# Patient Record
Sex: Male | Born: 1955
Health system: Southern US, Community
[De-identification: ages and names within clinical notes are randomized; demographics above are authoritative.]

## PROBLEM LIST (undated history)

## (undated) DIAGNOSIS — Z973 Presence of spectacles and contact lenses: Secondary | ICD-10-CM

## (undated) DIAGNOSIS — I10 Essential (primary) hypertension: Secondary | ICD-10-CM

## (undated) DIAGNOSIS — J302 Other seasonal allergic rhinitis: Secondary | ICD-10-CM

## (undated) HISTORY — PX: COLONOSCOPY: SHX174

## (undated) HISTORY — PX: WISDOM TOOTH EXTRACTION: SHX21

---

## 1999-05-18 ENCOUNTER — Ambulatory Visit (HOSPITAL_BASED_OUTPATIENT_CLINIC_OR_DEPARTMENT_OTHER): Admission: RE | Admit: 1999-05-18 | Discharge: 1999-05-18 | Payer: Self-pay | Admitting: *Deleted

## 2004-07-20 ENCOUNTER — Ambulatory Visit (HOSPITAL_COMMUNITY): Admission: RE | Admit: 2004-07-20 | Discharge: 2004-07-20 | Payer: Self-pay | Admitting: Gastroenterology

## 2010-08-17 ENCOUNTER — Other Ambulatory Visit: Payer: Self-pay | Admitting: Gastroenterology

## 2011-10-22 ENCOUNTER — Other Ambulatory Visit: Payer: Self-pay | Admitting: Orthopedic Surgery

## 2011-11-16 ENCOUNTER — Encounter (HOSPITAL_BASED_OUTPATIENT_CLINIC_OR_DEPARTMENT_OTHER): Payer: Self-pay | Admitting: *Deleted

## 2011-11-16 NOTE — Progress Notes (Signed)
Will need to get istat and ekg-has never seen a cardiologist-just got back from vacation-cannot come today-will come in early

## 2011-11-17 ENCOUNTER — Encounter (HOSPITAL_BASED_OUTPATIENT_CLINIC_OR_DEPARTMENT_OTHER)
Admission: RE | Disposition: A | Discharge status: Home / Self Care | Payer: Self-pay | Source: Ambulatory Visit | Attending: Orthopedic Surgery

## 2011-11-17 ENCOUNTER — Encounter (HOSPITAL_BASED_OUTPATIENT_CLINIC_OR_DEPARTMENT_OTHER): Payer: Self-pay | Admitting: Orthopedic Surgery

## 2011-11-17 ENCOUNTER — Encounter (HOSPITAL_BASED_OUTPATIENT_CLINIC_OR_DEPARTMENT_OTHER): Payer: Self-pay | Admitting: Anesthesiology

## 2011-11-17 ENCOUNTER — Ambulatory Visit (HOSPITAL_BASED_OUTPATIENT_CLINIC_OR_DEPARTMENT_OTHER): Payer: Managed Care, Other (non HMO) | Admitting: Anesthesiology

## 2011-11-17 ENCOUNTER — Encounter (HOSPITAL_BASED_OUTPATIENT_CLINIC_OR_DEPARTMENT_OTHER): Payer: Self-pay | Admitting: *Deleted

## 2011-11-17 ENCOUNTER — Ambulatory Visit (HOSPITAL_BASED_OUTPATIENT_CLINIC_OR_DEPARTMENT_OTHER)
Admission: RE | Admit: 2011-11-17 | Discharge: 2011-11-17 | Disposition: A | Discharge status: Home / Self Care | Payer: Managed Care, Other (non HMO) | Source: Ambulatory Visit | Attending: Orthopedic Surgery | Admitting: Orthopedic Surgery

## 2011-11-17 DIAGNOSIS — I10 Essential (primary) hypertension: Secondary | ICD-10-CM | POA: Insufficient documentation

## 2011-11-17 DIAGNOSIS — M7020 Olecranon bursitis, unspecified elbow: Secondary | ICD-10-CM | POA: Insufficient documentation

## 2011-11-17 DIAGNOSIS — M1A9XX1 Chronic gout, unspecified, with tophus (tophi): Secondary | ICD-10-CM | POA: Insufficient documentation

## 2011-11-17 HISTORY — DX: Other seasonal allergic rhinitis: J30.2

## 2011-11-17 HISTORY — DX: Essential (primary) hypertension: I10

## 2011-11-17 HISTORY — PX: OLECRANON BURSECTOMY: SHX2097

## 2011-11-17 LAB — POCT I-STAT, CHEM 8
BUN: 29 mg/dL — ABNORMAL HIGH (ref 6–23)
Creatinine, Ser: 1 mg/dL (ref 0.50–1.35)
Potassium: 5.5 mEq/L — ABNORMAL HIGH (ref 3.5–5.1)
Sodium: 141 mEq/L (ref 135–145)

## 2011-11-17 SURGERY — BURSECTOMY, ELBOW
Anesthesia: General | Site: Elbow | Laterality: Right | Wound class: Clean

## 2011-11-17 MED ORDER — OXYCODONE HCL 5 MG PO TABS: 5.0000 mg | ORAL_TABLET | Freq: Once | ORAL | Status: DC | PRN

## 2011-11-17 MED ORDER — CEFAZOLIN SODIUM-DEXTROSE 2-3 GM-% IV SOLR
2.0000 g | INTRAVENOUS | Status: AC
  Administered 2011-11-17: 2 g via INTRAVENOUS

## 2011-11-17 MED ORDER — HYDROMORPHONE HCL PF 1 MG/ML IJ SOLN
0.2500 mg | INTRAMUSCULAR | Status: DC | PRN
  Administered 2011-11-17 (×2): 0.5 mg via INTRAVENOUS

## 2011-11-17 MED ORDER — CHLORHEXIDINE GLUCONATE 4 % EX LIQD: 60.0000 mL | Freq: Once | CUTANEOUS | Status: DC

## 2011-11-17 MED ORDER — METOCLOPRAMIDE HCL 5 MG/ML IJ SOLN: 10.0000 mg | Freq: Once | INTRAMUSCULAR | Status: DC | PRN

## 2011-11-17 MED ORDER — LACTATED RINGERS IV SOLN
INTRAVENOUS | Status: DC
  Administered 2011-11-17 (×3): via INTRAVENOUS

## 2011-11-17 MED ORDER — SCOPOLAMINE 1 MG/3DAYS TD PT72
1.0000 | MEDICATED_PATCH | Freq: Once | TRANSDERMAL | Status: DC
  Administered 2011-11-17: 1.5 mg via TRANSDERMAL

## 2011-11-17 MED ORDER — OXYCODONE HCL 5 MG/5ML PO SOLN: 5.0000 mg | Freq: Once | ORAL | Status: DC | PRN

## 2011-11-17 MED ORDER — DEXAMETHASONE SODIUM PHOSPHATE 4 MG/ML IJ SOLN
INTRAMUSCULAR | Status: DC | PRN
  Administered 2011-11-17: 10 mg via INTRAVENOUS

## 2011-11-17 MED ORDER — ACETAMINOPHEN 10 MG/ML IV SOLN
1000.0000 mg | Freq: Once | INTRAVENOUS | Status: AC
  Administered 2011-11-17: 1000 mg via INTRAVENOUS

## 2011-11-17 MED ORDER — PROPOFOL 10 MG/ML IV BOLUS
INTRAVENOUS | Status: DC | PRN
  Administered 2011-11-17: 300 mg via INTRAVENOUS

## 2011-11-17 MED ORDER — BUPIVACAINE HCL (PF) 0.25 % IJ SOLN
INTRAMUSCULAR | Status: DC | PRN
  Administered 2011-11-17: 5 mL

## 2011-11-17 MED ORDER — ONDANSETRON HCL 4 MG/2ML IJ SOLN
INTRAMUSCULAR | Status: DC | PRN
  Administered 2011-11-17: 4 mg via INTRAVENOUS

## 2011-11-17 MED ORDER — OXYCODONE-ACETAMINOPHEN 7.5-500 MG PO TABS: 1.0000 | ORAL_TABLET | ORAL | Status: AC | PRN

## 2011-11-17 MED ORDER — LIDOCAINE HCL (CARDIAC) 20 MG/ML IV SOLN
INTRAVENOUS | Status: DC | PRN
  Administered 2011-11-17: 50 mg via INTRAVENOUS

## 2011-11-17 SURGICAL SUPPLY — 51 items
BANDAGE GAUZE ELAST BULKY 4 IN (GAUZE/BANDAGES/DRESSINGS) ×2 IMPLANT
BLADE MINI RND TIP GREEN BEAV (BLADE) IMPLANT
BLADE SURG 15 STRL LF DISP TIS (BLADE) ×1 IMPLANT
BLADE SURG 15 STRL SS (BLADE) ×4
BNDG CMPR 9X4 STRL LF SNTH (GAUZE/BANDAGES/DRESSINGS) ×1
BNDG COHESIVE 3X5 TAN STRL LF (GAUZE/BANDAGES/DRESSINGS) ×4 IMPLANT
BNDG ESMARK 4X9 LF (GAUZE/BANDAGES/DRESSINGS) ×2 IMPLANT
CHLORAPREP W/TINT 26ML (MISCELLANEOUS) ×2 IMPLANT
CLOTH BEACON ORANGE TIMEOUT ST (SAFETY) ×2 IMPLANT
CORDS BIPOLAR (ELECTRODE) ×2 IMPLANT
COVER MAYO STAND STRL (DRAPES) ×2 IMPLANT
COVER TABLE BACK 60X90 (DRAPES) ×2 IMPLANT
CUFF TOURNIQUET SINGLE 18IN (TOURNIQUET CUFF) ×2 IMPLANT
DRAPE EXTREMITY T 121X128X90 (DRAPE) ×2 IMPLANT
DRAPE SURG 17X23 STRL (DRAPES) ×2 IMPLANT
DRSG PAD ABDOMINAL 8X10 ST (GAUZE/BANDAGES/DRESSINGS) ×2 IMPLANT
GAUZE SPONGE 4X4 16PLY XRAY LF (GAUZE/BANDAGES/DRESSINGS) IMPLANT
GAUZE XEROFORM 1X8 LF (GAUZE/BANDAGES/DRESSINGS) ×2 IMPLANT
GLOVE BIO SURGEON STRL SZ 6.5 (GLOVE) ×2 IMPLANT
GLOVE SURG ORTHO 8.0 STRL STRW (GLOVE) ×2 IMPLANT
GOWN BRE IMP PREV XXLGXLNG (GOWN DISPOSABLE) ×2 IMPLANT
GOWN PREVENTION PLUS XLARGE (GOWN DISPOSABLE) ×2 IMPLANT
NDL SUT 6 .5 CRC .975X.05 MAYO (NEEDLE) IMPLANT
NEEDLE 27GAX1X1/2 (NEEDLE) ×2 IMPLANT
NEEDLE MAYO TAPER (NEEDLE)
NS IRRIG 1000ML POUR BTL (IV SOLUTION) ×2 IMPLANT
PACK BASIN DAY SURGERY FS (CUSTOM PROCEDURE TRAY) ×2 IMPLANT
PAD CAST 3X4 CTTN HI CHSV (CAST SUPPLIES) ×1 IMPLANT
PAD CAST 4YDX4 CTTN HI CHSV (CAST SUPPLIES) ×1 IMPLANT
PADDING CAST ABS 4INX4YD NS (CAST SUPPLIES)
PADDING CAST ABS COTTON 4X4 ST (CAST SUPPLIES) ×1 IMPLANT
PADDING CAST COTTON 3X4 STRL (CAST SUPPLIES) ×2
PADDING CAST COTTON 4X4 STRL (CAST SUPPLIES) ×2
SLEEVE SCD COMPRESS KNEE MED (MISCELLANEOUS) ×1 IMPLANT
SPLINT PLASTER CAST XFAST 3X15 (CAST SUPPLIES) ×30 IMPLANT
SPLINT PLASTER XTRA FASTSET 3X (CAST SUPPLIES) ×30
SPONGE GAUZE 4X4 12PLY (GAUZE/BANDAGES/DRESSINGS) ×2 IMPLANT
STOCKINETTE 4X48 STRL (DRAPES) ×2 IMPLANT
SUT BONE WAX W31G (SUTURE) IMPLANT
SUT CHROMIC 4 0 P 3 18 (SUTURE) IMPLANT
SUT MERSILENE 3 0 FS 1 (SUTURE) IMPLANT
SUT VIC AB 4-0 RB1 27 (SUTURE)
SUT VIC AB 4-0 RB1 27X BRD (SUTURE) IMPLANT
SUT VICRYL 4-0 PS2 18IN ABS (SUTURE) ×1 IMPLANT
SUT VICRYL RAPID 5 0 P 3 (SUTURE) IMPLANT
SUT VICRYL RAPIDE 4/0 PS 2 (SUTURE) ×2 IMPLANT
SYR BULB 3OZ (MISCELLANEOUS) ×2 IMPLANT
SYR CONTROL 10ML LL (SYRINGE) ×1 IMPLANT
TOWEL OR 17X24 6PK STRL BLUE (TOWEL DISPOSABLE) ×4 IMPLANT
UNDERPAD 30X30 INCONTINENT (UNDERPADS AND DIAPERS) ×2 IMPLANT
WATER STERILE IRR 1000ML POUR (IV SOLUTION) ×2 IMPLANT

## 2011-11-17 NOTE — Anesthesia Preprocedure Evaluation (Signed)
Anesthesia Evaluation  Patient identified by MRN, date of birth, ID band Patient awake    Reviewed: Allergy & Precautions, H&P , NPO status , Patient's Chart, lab work & pertinent test results, reviewed documented beta blocker date and time   Airway Mallampati: II TM Distance: >3 FB Neck ROM: full    Dental   Pulmonary neg pulmonary ROS,  breath sounds clear to auscultation        Cardiovascular hypertension, Rhythm:regular     Neuro/Psych negative neurological ROS  negative psych ROS   GI/Hepatic negative GI ROS, Neg liver ROS,   Endo/Other  negative endocrine ROS  Renal/GU negative Renal ROS  negative genitourinary   Musculoskeletal   Abdominal   Peds  Hematology negative hematology ROS (+)   Anesthesia Other Findings See surgeon's H&P   Reproductive/Obstetrics negative OB ROS                           Anesthesia Physical Anesthesia Plan  ASA: II  Anesthesia Plan: General   Post-op Pain Management:    Induction: Intravenous  Airway Management Planned: LMA  Additional Equipment:   Intra-op Plan:   Post-operative Plan: Extubation in OR  Informed Consent: I have reviewed the patients History and Physical, chart, labs and discussed the procedure including the risks, benefits and alternatives for the proposed anesthesia with the patient or authorized representative who has indicated his/her understanding and acceptance.   Dental Advisory Given  Plan Discussed with: CRNA and Surgeon  Anesthesia Plan Comments:         Anesthesia Quick Evaluation  

## 2011-11-17 NOTE — H&P (Signed)
Dennis Tate is a 56 year-old right-hand dominant male referred by Dr. Duane Lope for consultation with respect to a mass on his right elbow. This has been present for four years.  It is hard in nature.  He complains of the swelling when he hits the area it causes significant pain.  He complains of an intermittent mild, aching type pain.  He states it is gradually getting worse. Activity and exercise make this worse. Rest helps.  He has no history of diabetes, thyroid problems or arthritis, but does have history of gout.  There is family history of diabetes and arthritis.  He has been tested for diabetes.  ALLERGIES:   None.  MEDICATIONS:     Blood pressure medicine.  SURGICAL HISTORY:    None  FAMILY MEDICAL HISTORY:  Positive for diabetes, high blood pressure.     SOCIAL HISTORY:     He does not smoke, he drinks socially.  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  REVIEW OF SYSTEMS:     Positive for glasses, high blood pressure, otherwise negative 14 points.  Dennis Tate is an 56 y.o. male.   Chief Complaint: Olecranon bursart HPI: see above  Past Medical History  Diagnosis Date  . Hypertension   . Seasonal allergies     Past Surgical History  Procedure Date  . Colonoscopy   . Wisdom tooth extraction     History reviewed. No pertinent family history. Social History:  reports that he quit smoking about 7 years ago. He does not have any smokeless tobacco history on file. He reports that he drinks alcohol. He reports that he does not use illicit drugs.  Allergies: No Known Allergies  Medications Prior to Admission  Medication Sig Dispense Refill  . fexofenadine (ALLEGRA) 180 MG tablet Take 180 mg by mouth as needed.      Marland Kitchen NIFEdipine (PROCARDIA-XL/ADALAT CC) 60 MG 24 hr tablet Take 60 mg by mouth daily.        Results for orders placed during the hospital encounter of 11/17/11 (from the past 48 hour(s))  POCT I-STAT, CHEM 8     Status: Abnormal   Collection Time   11/17/11  7:36 AM       Component Value Range Comment   Sodium 141  135 - 145 mEq/L    Potassium 5.5 (*) 3.5 - 5.1 mEq/L    Chloride 110  96 - 112 mEq/L    BUN 29 (*) 6 - 23 mg/dL    Creatinine, Ser 1.61  0.50 - 1.35 mg/dL    Glucose, Bld 096 (*) 70 - 99 mg/dL    Calcium, Ion 0.45 (*) 1.12 - 1.23 mmol/L    TCO2 22  0 - 100 mmol/L    Hemoglobin 15.6  13.0 - 17.0 g/dL    HCT 40.9  81.1 - 91.4 %     No results found.   Pertinent items are noted in HPI.  Blood pressure 134/84, pulse 74, temperature 97.4 F (36.3 C), temperature source Oral, resp. rate 16, height 6' (1.829 m), weight 205 lb 8 oz (93.214 kg), SpO2 94.00%.  General appearance: alert, cooperative and appears stated age Head: Normocephalic, without obvious abnormality Neck: no adenopathy Resp: clear to auscultation bilaterally Cardio: regular rate and rhythm, S1, S2 normal, no murmur, click, rub or gallop GI: soft, non-tender; bowel sounds normal; no masses,  no organomegaly Extremities: extremities normal, atraumatic, no cyanosis or edema Pulses: 2+ and symmetric Skin: Skin color, texture, turgor normal.  No rashes or lesions Neurologic: Grossly normal Incision/Wound: na  Assessment/Plan RADIOGRAPHS:     X-rays reveal a soft tissue only.  DIAGNOSIS:     Olecranon bursitis.  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  He desires to have this removed.  He is aware of the potential for infection, recurrence, injury to arteries, nerves and tendons, possibility of significant recurrence and necessity of splinting with compression garments following surgery.  He would like to proceed to have this done.  He is scheduled for olecranon bursectomy right elbow as an outpatient.  , R 11/17/2011, 7:50 AM

## 2011-11-17 NOTE — Transfer of Care (Signed)
Immediate Anesthesia Transfer of Care Note  Patient: Dennis Tate  Procedure(s) Performed: Procedure(s) (LRB) with comments: OLECRANON BURSA (Right) - EXCISION OLECRANON BURSA ON RIGHT  Patient Location: PACU  Anesthesia Type: General  Level of Consciousness: sedated  Airway & Oxygen Therapy: Patient Spontanous Breathing and Patient connected to face mask oxygen  Post-op Assessment: Report given to PACU RN and Post -op Vital signs reviewed and stable  Post vital signs: Reviewed and stable  Complications: No apparent anesthesia complications

## 2011-11-17 NOTE — Op Note (Signed)
Dictated number: [id number]

## 2011-11-17 NOTE — Anesthesia Postprocedure Evaluation (Signed)
Anesthesia Post Note  Patient: Dennis Tate  Procedure(s) Performed: Procedure(s) (LRB): OLECRANON BURSA (Right)  Anesthesia type: General  Patient location: PACU  Post pain: Pain level controlled  Post assessment: Patient's Cardiovascular Status Stable  Last Vitals:  Filed Vitals:   11/17/11 1030  BP: 133/76  Pulse: 65  Temp:   Resp: 15    Post vital signs: Reviewed and stable  Level of consciousness: alert  Complications: No apparent anesthesia complications

## 2011-11-17 NOTE — Anesthesia Procedure Notes (Signed)
Procedure Name: LMA Insertion Date/Time: 11/17/2011 8:45 AM Performed by: Gar Gibbon Pre-anesthesia Checklist: Patient identified, Emergency Drugs available, Suction available and Patient being monitored Patient Re-evaluated:Patient Re-evaluated prior to inductionOxygen Delivery Method: Circle System Utilized Preoxygenation: Pre-oxygenation with 100% oxygen Intubation Type: IV induction Ventilation: Mask ventilation without difficulty LMA: LMA inserted LMA Size: 5.0 Number of attempts: 1 Airway Equipment and Method: bite block Placement Confirmation: positive ETCO2 Tube secured with: Tape Dental Injury: Teeth and Oropharynx as per pre-operative assessment

## 2011-11-17 NOTE — Brief Op Note (Signed)
11/17/2011  9:41 AM  PATIENT:  Dennis Tate  56 y.o. male  PRE-OPERATIVE DIAGNOSIS:  OLECRANON BURSA ON RIGHT  POST-OPERATIVE DIAGNOSIS:  OLECRANON BURSA ON RIGHT  PROCEDURE:  Procedure(s) (LRB) with comments: OLECRANON BURSA (Right) - EXCISION OLECRANON BURSA ON RIGHT  SURGEON:  Surgeon(s) and Role:    * Nicki Reaper, MD - Primary  PHYSICIAN ASSISTANT:   ASSISTANTS: none   ANESTHESIA:   local and general  EBL:  Total I/O In: 1100 [I.V.:1100] Out: -   BLOOD ADMINISTERED:none  DRAINS: none   LOCAL MEDICATIONS USED:  MARCAINE     SPECIMEN:  Excision  DISPOSITION OF SPECIMEN:  PATHOLOGY  COUNTS:  YES  TOURNIQUET:   Total Tourniquet Time Documented: Upper Arm (Right) - 32 minutes  DICTATION: .Other Dictation: Dictation Number [id number]  PLAN OF CARE: Discharge to home after PACU  PATIENT DISPOSITION:  PACU - hemodynamically stable.

## 2011-11-18 ENCOUNTER — Encounter (HOSPITAL_BASED_OUTPATIENT_CLINIC_OR_DEPARTMENT_OTHER): Payer: Self-pay | Admitting: Orthopedic Surgery

## 2011-11-18 NOTE — Op Note (Signed)
Dennis Tate, Dennis Tate               ACCOUNT NO.:  [id number]  MEDICAL RECORD NO.:  [id number]  LOCATION:                                 FACILITY:  PHYSICIAN:  Cindee Salt, M.D.            DATE OF BIRTH:  DATE OF PROCEDURE:  11/17/2011 DATE OF DISCHARGE:                              OPERATIVE REPORT   PREOPERATIVE DIAGNOSIS:  Olecranon bursitis with gouty tophus.  POSTOPERATIVE DIAGNOSIS:  Olecranon bursitis with gouty tophus.  OPERATION:  Excision of olecranon bursa, right elbow, large tophus.  SURGEON:  Cindee Salt, MD  ANESTHESIA:  General with local infiltration.  ANESTHESIOLOGIST:  Janetta Hora. Frederick, MD  HISTORY:  The patient is a 56 year old male with history of a large mass on the posterior aspect of his olecranon, right elbow.  He is desirous of having this excised.  Pre, peri, and postoperative course had been discussed along with risks and complications.  He is aware that there was no guarantee with the surgery; possibility of infection; recurrence of injury to arteries, nerves, tendons; incomplete relief of symptoms; dystrophy; possibility of reaccumulation; necessity of further surgical intervention.  Pre, peri and postoperative course had been discussed. He was seen in the preoperative area, the extremity was marked by both the patient and surgeon, and antibiotic given.  PROCEDURE:  The patient was brought to the operating room.  A general anesthetic was carried out without difficulty.  He was prepped using ChloraPrep, supine position, right arm free.  A 3-minute dry time was allowed.  Time-out taken, confirming the patient and procedure.  The limb was exsanguinated with an Esmarch bandage.  Tourniquet placed high on the arm was inflated to 250 mmHg.  A longitudinal incision was made over the posterior aspect of the elbow carried down through the subcutaneous tissue.  Gouty tophaceous material was immediately encountered.  The olecranon bursa was identified.   With blunt and sharp dissection, this was dissected free, taking care to protect the ulnar nerve on the medial side.  The bursa was inadvertently opened and then came almost to the skin.  A large amount of tophaceous material and fluid were extracted, this was then clamped to prevent further exudation.  The bursa was then completely excised.  The triceps was infiltrated with tophaceous material, this was debrided as much as possible.  Great care was taken to protect the insertion of the triceps tendon.  The wound was copiously irrigated with saline.  Bleeders were electrocauterized with bipolar throughout the procedure.  After copious irrigation, the subcutaneous tissue was closed with interrupted 4-0 Vicryl and skin with interrupted 4-0 Vicryl Rapide.  A 5 mL of Marcaine without epinephrine was then injected 0.25%.  A sterile compressive dressing, long-arm splint with the elbow flexed 90 degrees was applied.  On deflation of the tourniquet, all fingers were immediately pinked.  He was taken to the recovery room for observation.          ______________________________ Cindee Salt, M.D.     GK/MEDQ  D:  11/17/2011  T:  11/18/2011  Job:  [id number]

## 2011-11-23 ENCOUNTER — Encounter (HOSPITAL_BASED_OUTPATIENT_CLINIC_OR_DEPARTMENT_OTHER): Payer: Self-pay

## 2012-03-13 ENCOUNTER — Other Ambulatory Visit: Payer: Self-pay | Admitting: Orthopedic Surgery

## 2012-03-13 ENCOUNTER — Encounter (HOSPITAL_BASED_OUTPATIENT_CLINIC_OR_DEPARTMENT_OTHER): Payer: Self-pay | Admitting: *Deleted

## 2012-03-13 NOTE — Progress Notes (Signed)
Pt added on for tomorrow-computor will not print any sticker-asley could not print-Cindi said do lab tomorrow-pt was fine with that Will need istat

## 2012-03-14 ENCOUNTER — Encounter (HOSPITAL_BASED_OUTPATIENT_CLINIC_OR_DEPARTMENT_OTHER): Payer: Self-pay | Admitting: Orthopedic Surgery

## 2012-03-14 ENCOUNTER — Encounter (HOSPITAL_BASED_OUTPATIENT_CLINIC_OR_DEPARTMENT_OTHER)
Admission: RE | Disposition: A | Discharge status: Home / Self Care | Payer: Self-pay | Source: Ambulatory Visit | Attending: Orthopedic Surgery

## 2012-03-14 ENCOUNTER — Encounter (HOSPITAL_BASED_OUTPATIENT_CLINIC_OR_DEPARTMENT_OTHER): Payer: Self-pay | Admitting: Anesthesiology

## 2012-03-14 ENCOUNTER — Ambulatory Visit (HOSPITAL_BASED_OUTPATIENT_CLINIC_OR_DEPARTMENT_OTHER): Payer: Managed Care, Other (non HMO) | Admitting: Anesthesiology

## 2012-03-14 ENCOUNTER — Ambulatory Visit (HOSPITAL_BASED_OUTPATIENT_CLINIC_OR_DEPARTMENT_OTHER)
Admission: RE | Admit: 2012-03-14 | Discharge: 2012-03-14 | Disposition: A | Discharge status: Home / Self Care | Payer: Managed Care, Other (non HMO) | Source: Ambulatory Visit | Attending: Orthopedic Surgery | Admitting: Orthopedic Surgery

## 2012-03-14 DIAGNOSIS — I10 Essential (primary) hypertension: Secondary | ICD-10-CM | POA: Insufficient documentation

## 2012-03-14 DIAGNOSIS — M7020 Olecranon bursitis, unspecified elbow: Secondary | ICD-10-CM | POA: Insufficient documentation

## 2012-03-14 HISTORY — PX: I & D EXTREMITY: SHX5045

## 2012-03-14 HISTORY — DX: Presence of spectacles and contact lenses: Z97.3

## 2012-03-14 LAB — POCT I-STAT, CHEM 8
BUN: 19 mg/dL (ref 6–23)
Chloride: 105 mEq/L (ref 96–112)
HCT: 47 % (ref 39.0–52.0)
Potassium: 3.9 mEq/L (ref 3.5–5.1)
Sodium: 139 mEq/L (ref 135–145)

## 2012-03-14 SURGERY — IRRIGATION AND DEBRIDEMENT EXTREMITY
Anesthesia: General | Site: Elbow | Laterality: Right | Wound class: Contaminated

## 2012-03-14 MED ORDER — MIDAZOLAM HCL 5 MG/5ML IJ SOLN
INTRAMUSCULAR | Status: DC | PRN
  Administered 2012-03-14: 1 mg via INTRAVENOUS

## 2012-03-14 MED ORDER — MIDAZOLAM HCL 2 MG/2ML IJ SOLN: 1.0000 mg | INTRAMUSCULAR | Status: DC | PRN

## 2012-03-14 MED ORDER — HYDROMORPHONE HCL PF 1 MG/ML IJ SOLN: 0.2500 mg | INTRAMUSCULAR | Status: DC | PRN

## 2012-03-14 MED ORDER — FENTANYL CITRATE 0.05 MG/ML IJ SOLN: 50.0000 ug | Freq: Once | INTRAMUSCULAR | Status: DC

## 2012-03-14 MED ORDER — BUPIVACAINE HCL (PF) 0.25 % IJ SOLN
INTRAMUSCULAR | Status: DC | PRN
  Administered 2012-03-14: 3 mL

## 2012-03-14 MED ORDER — ONDANSETRON HCL 4 MG/2ML IJ SOLN
INTRAMUSCULAR | Status: DC | PRN
  Administered 2012-03-14: 4 mg via INTRAVENOUS

## 2012-03-14 MED ORDER — HYDROCODONE-ACETAMINOPHEN 5-500 MG PO TABS: 1.0000 | ORAL_TABLET | Freq: Four times a day (QID) | ORAL | Status: DC | PRN

## 2012-03-14 MED ORDER — OXYCODONE HCL 5 MG PO TABS: 5.0000 mg | ORAL_TABLET | Freq: Once | ORAL | Status: DC | PRN

## 2012-03-14 MED ORDER — OXYCODONE HCL 5 MG/5ML PO SOLN: 5.0000 mg | Freq: Once | ORAL | Status: DC | PRN

## 2012-03-14 MED ORDER — LIDOCAINE HCL (CARDIAC) 20 MG/ML IV SOLN
INTRAVENOUS | Status: DC | PRN
  Administered 2012-03-14: 60 mg via INTRAVENOUS

## 2012-03-14 MED ORDER — DEXAMETHASONE SODIUM PHOSPHATE 10 MG/ML IJ SOLN
INTRAMUSCULAR | Status: DC | PRN
  Administered 2012-03-14: 10 mg via INTRAVENOUS

## 2012-03-14 MED ORDER — CHLORHEXIDINE GLUCONATE 4 % EX LIQD: 60.0000 mL | Freq: Once | CUTANEOUS | Status: DC

## 2012-03-14 MED ORDER — CEFAZOLIN SODIUM 1-5 GM-% IV SOLN
INTRAVENOUS | Status: DC | PRN
  Administered 2012-03-14: 2 g via INTRAVENOUS

## 2012-03-14 MED ORDER — OXYCODONE HCL 5 MG PO TABS
5.0000 mg | ORAL_TABLET | Freq: Once | ORAL | Status: AC | PRN
  Administered 2012-03-14: 5 mg via ORAL

## 2012-03-14 MED ORDER — OXYCODONE HCL 5 MG/5ML PO SOLN: 5.0000 mg | Freq: Once | ORAL | Status: AC | PRN

## 2012-03-14 MED ORDER — PROMETHAZINE HCL 25 MG/ML IJ SOLN: 6.2500 mg | INTRAMUSCULAR | Status: DC | PRN

## 2012-03-14 MED ORDER — PROPOFOL 10 MG/ML IV BOLUS
INTRAVENOUS | Status: DC | PRN
  Administered 2012-03-14: 200 mg via INTRAVENOUS

## 2012-03-14 MED ORDER — LACTATED RINGERS IV SOLN
INTRAVENOUS | Status: DC
  Administered 2012-03-14 (×2): via INTRAVENOUS

## 2012-03-14 MED ORDER — FENTANYL CITRATE 0.05 MG/ML IJ SOLN
INTRAMUSCULAR | Status: DC | PRN
  Administered 2012-03-14 (×2): 25 ug via INTRAVENOUS
  Administered 2012-03-14: 50 ug via INTRAVENOUS

## 2012-03-14 MED ORDER — HYDROMORPHONE HCL PF 1 MG/ML IJ SOLN
0.2500 mg | INTRAMUSCULAR | Status: DC | PRN
  Administered 2012-03-14 (×4): 0.5 mg via INTRAVENOUS

## 2012-03-14 SURGICAL SUPPLY — 49 items
BAG DECANTER FOR FLEXI CONT (MISCELLANEOUS) IMPLANT
BANDAGE GAUZE ELAST BULKY 4 IN (GAUZE/BANDAGES/DRESSINGS) ×2 IMPLANT
BLADE MINI RND TIP GREEN BEAV (BLADE) IMPLANT
BLADE SURG 15 STRL LF DISP TIS (BLADE) ×1 IMPLANT
BLADE SURG 15 STRL SS (BLADE) ×2
BNDG CMPR 9X4 STRL LF SNTH (GAUZE/BANDAGES/DRESSINGS) ×1
BNDG COHESIVE 1X5 TAN STRL LF (GAUZE/BANDAGES/DRESSINGS) IMPLANT
BNDG COHESIVE 3X5 TAN STRL LF (GAUZE/BANDAGES/DRESSINGS) ×2 IMPLANT
BNDG ESMARK 4X9 LF (GAUZE/BANDAGES/DRESSINGS) ×1 IMPLANT
CHLORAPREP W/TINT 26ML (MISCELLANEOUS) ×2 IMPLANT
CLOTH BEACON ORANGE TIMEOUT ST (SAFETY) ×2 IMPLANT
CORDS BIPOLAR (ELECTRODE) ×2 IMPLANT
COVER MAYO STAND STRL (DRAPES) ×2 IMPLANT
COVER TABLE BACK 60X90 (DRAPES) ×2 IMPLANT
CUFF TOURNIQUET SINGLE 18IN (TOURNIQUET CUFF) ×1 IMPLANT
DRAPE EXTREMITY T 121X128X90 (DRAPE) ×2 IMPLANT
DRAPE SURG 17X23 STRL (DRAPES) ×2 IMPLANT
DRSG KUZMA FLUFF (GAUZE/BANDAGES/DRESSINGS) ×1 IMPLANT
GAUZE PACKING IODOFORM 1/4X5 (PACKING) IMPLANT
GAUZE XEROFORM 1X8 LF (GAUZE/BANDAGES/DRESSINGS) ×2 IMPLANT
GLOVE BIO SURGEON STRL SZ 6.5 (GLOVE) ×1 IMPLANT
GLOVE BIOGEL PI IND STRL 8.5 (GLOVE) ×1 IMPLANT
GLOVE BIOGEL PI INDICATOR 8.5 (GLOVE) ×1
GLOVE SURG ORTHO 8.0 STRL STRW (GLOVE) ×2 IMPLANT
GOWN BRE IMP PREV XXLGXLNG (GOWN DISPOSABLE) ×3 IMPLANT
GOWN PREVENTION PLUS XLARGE (GOWN DISPOSABLE) ×2 IMPLANT
LOOP VESSEL MAXI BLUE (MISCELLANEOUS) IMPLANT
NEEDLE 27GAX1X1/2 (NEEDLE) ×1 IMPLANT
NS IRRIG 1000ML POUR BTL (IV SOLUTION) ×2 IMPLANT
PACK BASIN DAY SURGERY FS (CUSTOM PROCEDURE TRAY) ×2 IMPLANT
PAD CAST 3X4 CTTN HI CHSV (CAST SUPPLIES) IMPLANT
PADDING CAST ABS 4INX4YD NS (CAST SUPPLIES) ×1
PADDING CAST ABS COTTON 4X4 ST (CAST SUPPLIES) ×1 IMPLANT
PADDING CAST COTTON 3X4 STRL (CAST SUPPLIES) ×4
SPLINT PLASTER CAST XFAST 3X15 (CAST SUPPLIES) ×30 IMPLANT
SPLINT PLASTER XTRA FASTSET 3X (CAST SUPPLIES) ×30
SPONGE GAUZE 4X4 12PLY (GAUZE/BANDAGES/DRESSINGS) ×2 IMPLANT
STOCKINETTE 4X48 STRL (DRAPES) ×2 IMPLANT
SUT ETHILON 4 0 PS 2 18 (SUTURE) ×1 IMPLANT
SUT VICRYL RAPID 5 0 P 3 (SUTURE) IMPLANT
SUT VICRYL RAPIDE 4/0 PS 2 (SUTURE) ×1 IMPLANT
SWAB COLLECTION DEVICE MRSA (MISCELLANEOUS) IMPLANT
SYR BULB 3OZ (MISCELLANEOUS) ×2 IMPLANT
SYR CONTROL 10ML LL (SYRINGE) ×1 IMPLANT
TOWEL OR 17X24 6PK STRL BLUE (TOWEL DISPOSABLE) ×3 IMPLANT
TUBE ANAEROBIC SPECIMEN COL (MISCELLANEOUS) ×1 IMPLANT
TUBE FEEDING 5FR 15 INCH (TUBING) IMPLANT
UNDERPAD 30X30 INCONTINENT (UNDERPADS AND DIAPERS) ×2 IMPLANT
WATER STERILE IRR 1000ML POUR (IV SOLUTION) ×1 IMPLANT

## 2012-03-14 NOTE — Anesthesia Preprocedure Evaluation (Addendum)
Anesthesia Evaluation  Patient identified by MRN, date of birth, ID band Patient awake    Reviewed: Allergy & Precautions, H&P , NPO status , Patient's Chart, lab work & pertinent test results, reviewed documented beta blocker date and time   Airway Mallampati: II TM Distance: >3 FB Neck ROM: full    Dental   Pulmonary neg pulmonary ROS, former smoker,  breath sounds clear to auscultation        Cardiovascular hypertension, Rhythm:regular Rate:Normal     Neuro/Psych negative neurological ROS  negative psych ROS   GI/Hepatic negative GI ROS, Neg liver ROS,   Endo/Other  negative endocrine ROS  Renal/GU negative Renal ROS  negative genitourinary   Musculoskeletal   Abdominal   Peds  Hematology negative hematology ROS (+)   Anesthesia Other Findings See surgeon's H&P   Reproductive/Obstetrics negative OB ROS                           Anesthesia Physical Anesthesia Plan  ASA: II  Anesthesia Plan: General   Post-op Pain Management:    Induction: Intravenous  Airway Management Planned: LMA  Additional Equipment:   Intra-op Plan:   Post-operative Plan: Extubation in OR  Informed Consent: I have reviewed the patients History and Physical, chart, labs and discussed the procedure including the risks, benefits and alternatives for the proposed anesthesia with the patient or authorized representative who has indicated his/her understanding and acceptance.     Plan Discussed with: CRNA and Surgeon  Anesthesia Plan Comments:         Anesthesia Quick Evaluation

## 2012-03-14 NOTE — Anesthesia Procedure Notes (Signed)
Procedure Name: LMA Insertion Date/Time: 03/14/2012 3:15 PM Performed by: Dessie Delcarlo D Pre-anesthesia Checklist: Patient identified, Emergency Drugs available, Suction available and Patient being monitored Patient Re-evaluated:Patient Re-evaluated prior to inductionOxygen Delivery Method: Circle System Utilized Preoxygenation: Pre-oxygenation with 100% oxygen Intubation Type: IV induction Ventilation: Mask ventilation without difficulty LMA: LMA inserted LMA Size: 5.0 Number of attempts: 1 Airway Equipment and Method: bite block Placement Confirmation: positive ETCO2 Tube secured with: Tape Dental Injury: Teeth and Oropharynx as per pre-operative assessment

## 2012-03-14 NOTE — H&P (Signed)
[name] is a 56 year-old right-hand dominant male with  a mass on his right elbow. This has been present for four years.  It is hard in nature.  He complains of the swelling when he hits the area it causes significant pain.  He complains of an intermittent mild, aching type pain.  He states it is gradually getting worse. Activity and exercise make this worse. Rest helps.  He has no history of diabetes, thyroid problems or arthritis, but does have history of gout.  There is family history of diabetes and arthritis.  He has been tested for diabetes. He is now four months following his excisional olecranon bursa for gouty tophus.  He states that the area began draining on his right arm last Friday.  He was placed on an antibiotic.  He is seen today with a small open area extruding gouty tophaceous material. Cultures are taken.  He is on Keflex.    ALLERGIES:   None.  MEDICATIONS:     Blood pressure medicine.  SURGICAL HISTORY:    None  FAMILY MEDICAL HISTORY:  Positive for diabetes, high blood pressure.     SOCIAL HISTORY:     He does not smoke, he drinks socially.  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  REVIEW OF SYSTEMS:     Positive for glasses, high blood pressure, otherwise negative 14 points.  Dennis Tate is an 56 y.o. male.   Chief Complaint: Recurrent olecranon bursa wit drainage  HPI: see above  Past Medical History  Diagnosis Date  . Hypertension   . Seasonal allergies   . Wears glasses     Past Surgical History  Procedure Date  . Colonoscopy   . Wisdom tooth extraction   . Olecranon bursectomy 11/17/2011    Procedure: OLECRANON BURSA;  Surgeon: Nicki Reaper, MD;  Location: [facility];  Service: Orthopedics;  Laterality: Right;  EXCISION OLECRANON BURSA ON RIGHT    History reviewed. No pertinent family history. Social History:  reports that he quit smoking about 7 years ago. He does not have any smokeless tobacco history on file. He reports that he drinks  alcohol. He reports that he does not use illicit drugs.  Allergies: No Known Allergies  Medications Prior to Admission  Medication Sig Dispense Refill  . cephALEXin (KEFLEX) 250 MG capsule Take 250 mg by mouth 4 (four) times daily.      Marland Kitchen NIFEdipine (PROCARDIA-XL/ADALAT CC) 60 MG 24 hr tablet Take 60 mg by mouth daily.      . fexofenadine (ALLEGRA) 180 MG tablet Take 180 mg by mouth as needed.        No results found for this or any previous visit (from the past 48 hour(s)).  No results found.   Pertinent items are noted in HPI.  Blood pressure 150/92, pulse 78, temperature 97.9 F (36.6 C), temperature source Oral, resp. rate 20, height 6' (1.829 m), weight 92.987 kg (205 lb), SpO2 99.00%.  General appearance: alert, cooperative and appears stated age Head: Normocephalic, without obvious abnormality Neck: no adenopathy Resp: clear to auscultation bilaterally Cardio: regular rate and rhythm, S1, S2 normal, no murmur, click, rub or gallop GI: soft, non-tender; bowel sounds normal; no masses,  no organomegaly Extremities: extremities normal, atraumatic, no cyanosis or edema Pulses: 2+ and symmetric Skin: Skin color, texture, turgor normal. No rashes or lesions Neurologic: Grossly normal Incision/Wound: Drainage rt elbow  Assessment/Plan We would recommend incision/drainage and packing of this.  This will  be scheduled  as an outpatient.  He is advised that this will be left open.    Right olecranon bursitis.  , R 03/14/2012, 2:11 PM

## 2012-03-14 NOTE — Brief Op Note (Signed)
03/14/2012  4:08 PM  PATIENT:  Dennis Tate  56 y.o. male  PRE-OPERATIVE DIAGNOSIS:  OLECRANON BURSAE RIGHT  POST-OPERATIVE DIAGNOSIS:  OLECRANON BURSAE RIGHT  PROCEDURE:  Procedure(s) (LRB) with comments: IRRIGATION AND DEBRIDEMENT EXTREMITY (Right) - I & D, PACKING OLECRANON BURSAE RIGHT  SURGEON:  Surgeon(s) and Role:    * Nicki Reaper, MD - Primary    * Tami Ribas, MD - Assisting  PHYSICIAN ASSISTANT:   ASSISTANTS: K ,MD   ANESTHESIA:   local and general  EBL:  Total I/O In: 1500 [I.V.:1500] Out: -   BLOOD ADMINISTERED:none  DRAINS: Penrose drain in the rt elbow   LOCAL MEDICATIONS USED:  MARCAINE     SPECIMEN:  Excision  DISPOSITION OF SPECIMEN:  PATHOLOGY  COUNTS:  YES  TOURNIQUET:  * Missing tourniquet times found for documented tourniquets in log:  [id number] *  DICTATION: .Other Dictation: Dictation Number [id number]  PLAN OF CARE: Discharge to home after PACU  PATIENT DISPOSITION:  PACU - hemodynamically stable.

## 2012-03-14 NOTE — Op Note (Signed)
Dictation Number [id number]

## 2012-03-14 NOTE — Transfer of Care (Signed)
Immediate Anesthesia Transfer of Care Note  Patient: Dennis Tate  Procedure(s) Performed: Procedure(s) (LRB) with comments: IRRIGATION AND DEBRIDEMENT EXTREMITY (Right) - I & D, PACKING OLECRANON BURSAE RIGHT  Patient Location: PACU  Anesthesia Type:General  Level of Consciousness: awake and patient cooperative  Airway & Oxygen Therapy: Patient Spontanous Breathing and Patient connected to face mask oxygen  Post-op Assessment: Report given to PACU RN and Post -op Vital signs reviewed and stable  Post vital signs: Reviewed and stable  Complications: No apparent anesthesia complications

## 2012-03-14 NOTE — Anesthesia Postprocedure Evaluation (Signed)
  Anesthesia Post-op Note  Patient: Dennis Tate  Procedure(s) Performed: Procedure(s) (LRB) with comments: IRRIGATION AND DEBRIDEMENT EXTREMITY (Right) - I & D, PACKING OLECRANON BURSAE RIGHT  Patient Location: PACU  Anesthesia Type:General  Level of Consciousness: awake and alert   Airway and Oxygen Therapy: Patient Spontanous Breathing  Post-op Pain: mild  Post-op Assessment: Post-op Vital signs reviewed, Patient's Cardiovascular Status Stable, Respiratory Function Stable, Patent Airway, No signs of Nausea or vomiting and Pain level controlled  Post-op Vital Signs: stable  Complications: No apparent anesthesia complications

## 2012-03-16 ENCOUNTER — Encounter (HOSPITAL_BASED_OUTPATIENT_CLINIC_OR_DEPARTMENT_OTHER): Payer: Self-pay | Admitting: Orthopedic Surgery

## 2012-03-16 LAB — WOUND CULTURE

## 2012-03-16 NOTE — Op Note (Deleted)
Dennis Tate, Dennis Tate               ACCOUNT NO.:  [id number]  MEDICAL RECORD NO.:  [id number]  LOCATION:                                 FACILITY:  PHYSICIAN:  Cindee Salt, M.D.       DATE OF BIRTH:  11-29-1955  DATE OF PROCEDURE:  03/14/2012 DATE OF DISCHARGE:  03/14/2012                              OPERATIVE REPORT   PREOPERATIVE DIAGNOSIS:  Recurrent olecranon bursa with drainage, right elbow.  POSTOPERATIVE DIAGNOSIS:  Recurrent olecranon bursa with drainage, right elbow.  OPERATION:  Excision of olecranon bursa, incision drainage, placement of a drainage tube, TLS wound drain.  SURGEON:  Cindee Salt, M.D.  ANESTHESIA:  General with local infiltration.  ANESTHESIOLOGIST:  Bedelia Person, M.D.  HISTORY:  The patient is a 57 year old male who is status post excision of olecranon bursa, right elbow.  This was done approximately 3 months ago.  He began having drainage from this.  He is admitted now for excision, incision and drainage, cultures.  He is aware of risks and complications including infection, recurrence injury to arteries, nerves, tendons, incomplete relief of symptoms.  In the preoperative area, the patient was seen, the extremity is marked by both the patient and surgeon.  Antibiotic was withheld.  PROCEDURE:  The patient was brought to the operating room where a general anesthetic was carried out without difficulty.  He was prepped using ChloraPrep, supine position with the right arm free.  The limb was exsanguinated with an Esmarch bandage.  Tourniquet placed high and the arm was then inflated to 250 mmHg.  The old incision was used, carried down through subcutaneous tissue, a large very thick bursal sac was immediately encountered.  This was filled with a clear fluid with multiple rice bodies indicative of a gout were expressed.  Large amount of tissue was removed.  It was decided to remove as much of the bursal sac is possible due to the gouty tophi the rice  bodies with gout within them.  This was done with sharp dissection and I entirely removed. Rongeur was then used to remove any tophi that were imbedded within the bone.  The triceps tendon was protected as much as possible.  The wound was copiously irrigated with saline.  A TLS drain was brought out to the ulnar aspect and that it was felt of the wound were left open.  This would have a very difficult time healing over the elbow. Cultures were taken.  The specimen was sent to Pathology.  The wound was closed with interrupted 4-0 nylon sutures over the TLS drain. A long-arm splint was applied.  On deflation of the tourniquet, all fingers immediately pinked.  Antibiotic was given.  He was taken to the recovery room for observation in satisfactory condition.  He will be discharged home and to return the [facility] [place] in 1 week on Septra DS and Vicodin.          ______________________________ Cindee Salt, M.D.     GK/MEDQ  D:  03/14/2012  T:  03/15/2012  Job:  [id number]

## 2012-03-16 NOTE — Op Note (Signed)
NAME:  Dennis Tate, Dennis Tate               ACCOUNT NO.:  [id number]  MEDICAL RECORD NO.:  [id number]  LOCATION:                                 FACILITY:  PHYSICIAN:   , M.D.       DATE OF BIRTH:  11/19/1955  DATE OF PROCEDURE:  03/14/2012 DATE OF DISCHARGE:  03/14/2012                              OPERATIVE REPORT   PREOPERATIVE DIAGNOSIS:  Recurrent olecranon bursa with drainage, right elbow.  POSTOPERATIVE DIAGNOSIS:  Recurrent olecranon bursa with drainage, right elbow.  OPERATION:  Excision of olecranon bursa, incision drainage, placement of a drainage tube, TLS wound drain.  SURGEON:   , M.D.  ANESTHESIA:  General with local infiltration.  ANESTHESIOLOGIST:  Lee Kasik, M.D.  HISTORY:  The patient is a 56-year-old male who is status post excision of olecranon bursa, right elbow.  This was done approximately 3 months ago.  He began having drainage from this.  He is admitted now for excision, incision and drainage, cultures.  He is aware of risks and complications including infection, recurrence injury to arteries, nerves, tendons, incomplete relief of symptoms.  In the preoperative area, the patient was seen, the extremity is marked by both the patient and surgeon.  Antibiotic was withheld.  PROCEDURE:  The patient was brought to the operating room where a general anesthetic was carried out without difficulty.  He was prepped using ChloraPrep, supine position with the right arm free.  The limb was exsanguinated with an Esmarch bandage.  Tourniquet placed high and the arm was then inflated to 250 mmHg.  The old incision was used, carried down through subcutaneous tissue, a large very thick bursal sac was immediately encountered.  This was filled with a clear fluid with multiple rice bodies indicative of a gout were expressed.  Large amount of tissue was removed.  It was decided to remove as much of the bursal sac is possible due to the gouty tophi the rice  bodies with gout within them.  This was done with sharp dissection and I entirely removed. Rongeur was then used to remove any tophi that were imbedded within the bone.  The triceps tendon was protected as much as possible.  The wound was copiously irrigated with saline.  A TLS drain was brought out to the ulnar aspect and that it was felt of the wound were left open.  This would have a very difficult time healing over the elbow. Cultures were taken.  The specimen was sent to Pathology.  The wound was closed with interrupted 4-0 nylon sutures over the TLS drain. A long-arm splint was applied.  On deflation of the tourniquet, all fingers immediately pinked.  Antibiotic was given.  He was taken to the recovery room for observation in satisfactory condition.  He will be discharged home and to return the [facility] in 1 week on Septra DS and Vicodin.          ______________________________  , M.D.     GK/MEDQ  D:  03/14/2012  T:  03/15/2012  Job:  [id number] 

## 2012-03-19 LAB — ANAEROBIC CULTURE

## 2012-04-09 LAB — FUNGUS CULTURE W SMEAR: Fungal Smear: NONE SEEN

## 2013-10-22 ENCOUNTER — Telehealth (INDEPENDENT_AMBULATORY_CARE_PROVIDER_SITE_OTHER): Payer: Self-pay

## 2013-10-22 ENCOUNTER — Ambulatory Visit (INDEPENDENT_AMBULATORY_CARE_PROVIDER_SITE_OTHER): Payer: Self-pay | Admitting: General Surgery

## 2013-10-22 ENCOUNTER — Emergency Department (HOSPITAL_COMMUNITY)
Admission: EM | Admit: 2013-10-22 | Discharge: 2013-10-22 | Disposition: A | Discharge status: Home / Self Care | Payer: Managed Care, Other (non HMO) | Attending: Emergency Medicine | Admitting: Emergency Medicine

## 2013-10-22 ENCOUNTER — Encounter (HOSPITAL_COMMUNITY): Payer: Self-pay | Admitting: Emergency Medicine

## 2013-10-22 DIAGNOSIS — K4090 Unilateral inguinal hernia, without obstruction or gangrene, not specified as recurrent: Secondary | ICD-10-CM | POA: Diagnosis not present

## 2013-10-22 DIAGNOSIS — Z79899 Other long term (current) drug therapy: Secondary | ICD-10-CM | POA: Insufficient documentation

## 2013-10-22 DIAGNOSIS — I10 Essential (primary) hypertension: Secondary | ICD-10-CM | POA: Insufficient documentation

## 2013-10-22 MED ORDER — OXYCODONE-ACETAMINOPHEN 5-325 MG PO TABS: 2.0000 | ORAL_TABLET | ORAL | Status: DC | PRN

## 2013-10-22 NOTE — Discharge Instructions (Signed)

## 2013-10-22 NOTE — ED Provider Notes (Signed)
CSN: [id number]     Arrival date & time 10/22/13  1349 History   First MD Initiated Contact with Patient 10/22/13 1456     Chief Complaint  Patient presents with  . Hernia     (Consider location/radiation/quality/duration/timing/severity/associated sxs/prior Treatment) Patient is a 58 y.o. male presenting with abdominal pain.  Abdominal Pain Pain location:  LLQ Pain quality: aching   Pain radiates to:  Does not radiate Pain severity:  Moderate Onset quality:  Gradual Duration:  8 weeks (acutely worse over last 24 hours) Progression:  Worsening Chronicity:  New Context: not previous surgeries, not recent illness and not recent travel   Relieved by: lying flat. Worsened by:  Nothing tried Ineffective treatments:  None tried Associated symptoms: no anorexia, no chest pain, no chills, no constipation, no cough, no diarrhea, no dysuria, no fever, no hematuria, no nausea, no shortness of breath, no sore throat and no vomiting     Past Medical History  Diagnosis Date  . Hypertension   . Seasonal allergies   . Wears glasses    Past Surgical History  Procedure Laterality Date  . Colonoscopy    . Wisdom tooth extraction    . Olecranon bursectomy  11/17/2011    Procedure: OLECRANON BURSA;  Surgeon: Nicki Reaper, MD;  Location: [facility];  Service: Orthopedics;  Laterality: Right;  EXCISION OLECRANON BURSA ON RIGHT  . I&d extremity  03/14/2012    Procedure: IRRIGATION AND DEBRIDEMENT EXTREMITY;  Surgeon: Nicki Reaper, MD;  Location: [facility];  Service: Orthopedics;  Laterality: Right;  I & D, PACKING OLECRANON BURSAE RIGHT   History reviewed. No pertinent family history. History  Substance Use Topics  . Smoking status: Former Smoker    Quit date: 11/15/2004  . Smokeless tobacco: Not on file  . Alcohol Use: Yes     Comment: occ    Review of Systems  Constitutional: Negative for fever and chills.  HENT: Negative for congestion, rhinorrhea and  sore throat.   Eyes: Negative for photophobia and visual disturbance.  Respiratory: Negative for cough and shortness of breath.   Cardiovascular: Negative for chest pain and leg swelling.  Gastrointestinal: Positive for abdominal pain. Negative for nausea, vomiting, diarrhea, constipation and anorexia.  Endocrine: Negative for polydipsia and polyuria.  Genitourinary: Negative for dysuria and hematuria.  Musculoskeletal: Negative for arthralgias and back pain.  Skin: Negative for color change and rash.  Neurological: Negative for dizziness, syncope, light-headedness and headaches.  Hematological: Negative for adenopathy. Does not bruise/bleed easily.  All other systems reviewed and are negative.     Allergies  Review of patient's allergies indicates no known allergies.  Home Medications   Prior to Admission medications   Medication Sig Start Date End Date Taking? Authorizing Provider  allopurinol (ZYLOPRIM) 300 MG tablet Take 300 mg by mouth daily.   Yes Historical Provider, MD  fexofenadine (ALLEGRA) 180 MG tablet Take 180 mg by mouth as needed.   Yes Historical Provider, MD  indomethacin (INDOCIN) 50 MG capsule Take 50 mg by mouth 2 (two) times daily as needed (pain. (gout flare up)).   Yes Historical Provider, MD  lisinopril (PRINIVIL,ZESTRIL) 10 MG tablet Take 10 mg by mouth daily.   Yes Historical Provider, MD  oxyCODONE-acetaminophen (PERCOCET/ROXICET) 5-325 MG per tablet Take 2 tablets by mouth every 4 (four) hours as needed for severe pain. 10/22/13   Mirian Mo, MD   BP 148/92  Pulse 66  Temp(Src) 98.2 F (36.8 C) (Oral)  Resp 18  Ht 6' (1.829 m)  Wt 201 lb (91.173 kg)  BMI 27.25 kg/m2  SpO2 100% Physical Exam  Vitals reviewed. Constitutional: He is oriented to person, place, and time. He appears well-developed and well-nourished.  HENT:  Head: Normocephalic and atraumatic.  Eyes: Conjunctivae and EOM are normal.  Neck: Normal range of motion. Neck supple.   Cardiovascular: Normal rate, regular rhythm and normal heart sounds.   Pulmonary/Chest: Effort normal and breath sounds normal. No respiratory distress.  Abdominal: He exhibits no distension. There is no tenderness. There is no rebound and no guarding. A hernia is present. Hernia confirmed positive in the right inguinal area (intermittent, reducible) and confirmed positive in the left inguinal area (intermittent reducible).  Genitourinary: Right testis shows no mass and no tenderness. Left testis shows no mass and no tenderness.  Musculoskeletal: Normal range of motion.  Lymphadenopathy:       Right: No inguinal adenopathy present.  Neurological: He is alert and oriented to person, place, and time.  Skin: Skin is warm and dry.    ED Course  Procedures (including critical care time) Labs Review Labs Reviewed - No data to display  Imaging Review No results found.   EKG Interpretation None      MDM   Final diagnoses:  Unilateral inguinal hernia without obstruction or gangrene, recurrence not specified    58 y.o. male  with pertinent PMH of HTN presents with likely inguinal hernia from PCP.  Pt has had intermittent L groin pain for 2 months, states that it has been worse over the last 24 hours.  He has no history of inguinal hernia, but states that he has been seen by his primary care doctor is concerned about his hernia. No antecedent injury. Apparently his primary care doctor was unable to reduce hernia Lajean SaverSisson here for evaluation of incarcerated inguinal hernia. On arrival vital signs as above, physical exam revealed no active herniation at this time however when the patient coughs was able to reproduce symptoms in the patient had palpable hernia.  It was easily reducible.  Patient states that he has had improvement in his pain. He denies she has symptoms. Discharged home in stable condition to followup with surgery and the patient was given strict precautions.  Labs and imaging as  above reviewed.   1. Unilateral inguinal hernia without obstruction or gangrene, recurrence not specified         Mirian MoMatthew Gentry, MD 10/22/13 [phone]

## 2013-10-22 NOTE — ED Notes (Signed)
Gave pt two warm blankets. 

## 2013-10-22 NOTE — ED Notes (Signed)
Pt states that he has been having increased pain in left lower groin area over the past several weeks. Pt states that he had a bulge in that same area last night that was increase in size. Worse with walking.  Pt visited primary care this morning who advised him to come to the ER.

## 2013-10-22 NOTE — Telephone Encounter (Signed)
Spoke to NormanStephanie @ Dr. Nash DimmerWong's office regarding patient Dennis Tate.  [facility] for further medical assessment of Dennis Tate.

## 2013-11-07 ENCOUNTER — Ambulatory Visit (INDEPENDENT_AMBULATORY_CARE_PROVIDER_SITE_OTHER): Payer: Managed Care, Other (non HMO) | Admitting: General Surgery

## 2013-11-07 ENCOUNTER — Encounter (INDEPENDENT_AMBULATORY_CARE_PROVIDER_SITE_OTHER): Payer: Self-pay | Admitting: General Surgery

## 2013-11-07 VITALS — BP 142/86 | HR 80 | Temp 97.1°F | Resp 16 | Ht 72.0 in | Wt 195.4 lb

## 2013-11-07 DIAGNOSIS — K4090 Unilateral inguinal hernia, without obstruction or gangrene, not specified as recurrent: Secondary | ICD-10-CM

## 2013-11-07 NOTE — Progress Notes (Signed)
Patient ID: Dennis Tate, male   DOB: September 22, 1955, 58 y.o.   MRN: [id number]  No chief complaint on file.   HPI Dennis Tate is a 58 y.o. male.  The patient is a 58 year old male is referred by Dr. Tenny Craw for evaluation of a left inguinal hernia. The patient states that several weeks ago he had intense left inguinal pain after golf, and lifting weights. The patient states that he's noticed a bulge in the left inguinal area. The patient has no pain to the right side. HPI  Past Medical History  Diagnosis Date  . Hypertension   . Seasonal allergies   . Wears glasses     Past Surgical History  Procedure Laterality Date  . Colonoscopy    . Wisdom tooth extraction    . Olecranon bursectomy  11/17/2011    Procedure: OLECRANON BURSA;  Surgeon: Nicki Reaper, MD;  Location: [facility];  Service: Orthopedics;  Laterality: Right;  EXCISION OLECRANON BURSA ON RIGHT  . I&d extremity  03/14/2012    Procedure: IRRIGATION AND DEBRIDEMENT EXTREMITY;  Surgeon: Nicki Reaper, MD;  Location: [facility];  Service: Orthopedics;  Laterality: Right;  I & D, PACKING OLECRANON BURSAE RIGHT    No family history on file.  Social History History  Substance Use Topics  . Smoking status: Former Smoker    Quit date: 11/15/2004  . Smokeless tobacco: Not on file  . Alcohol Use: Yes     Comment: occ    No Known Allergies  Current Outpatient Prescriptions  Medication Sig Dispense Refill  . allopurinol (ZYLOPRIM) 300 MG tablet Take 300 mg by mouth daily.      . fexofenadine (ALLEGRA) 180 MG tablet Take 180 mg by mouth as needed.      . indomethacin (INDOCIN) 50 MG capsule Take 50 mg by mouth 2 (two) times daily as needed (pain. (gout flare up)).      Marland Kitchen lisinopril (PRINIVIL,ZESTRIL) 10 MG tablet Take 10 mg by mouth daily.      Marland Kitchen oxyCODONE-acetaminophen (PERCOCET/ROXICET) 5-325 MG per tablet Take 2 tablets by mouth every 4 (four) hours as needed for severe pain.  15 tablet  0   No  current facility-administered medications for this visit.    Review of Systems Review of Systems  Constitutional: Negative.   HENT: Negative.   Eyes: Negative.   Respiratory: Negative.   Cardiovascular: Negative.   Gastrointestinal: Negative.   Endocrine: Negative.   Neurological: Negative.     Blood pressure 142/86, pulse 80, temperature 97.1 F (36.2 C), temperature source Temporal, resp. rate 16, height 6' (1.829 m), weight 195 lb 6.4 oz (88.633 kg).  Physical Exam Physical Exam  Abdominal: Soft. Bowel sounds are normal. He exhibits no distension. There is no tenderness. There is no rebound and no guarding. A hernia is present. Hernia confirmed positive in the left inguinal area. Hernia confirmed negative in the right inguinal area.    Data Reviewed none  Assessment    58 year old male with left inguinal hernia, likely indirect     Plan    1. The patient would like to proceed to the operating room for a laparoscopic left inguinal hernia repair with mesh. 2.All risks and benefits were discussed with the patient, to generally include infection, bleeding, damage to surrounding structures, acute and chronic nerve pain, and recurrence. Alternatives were offered and described.  All questions were answered and the patient voiced understanding of the procedure and wishes to proceed  at this point.         Marigene Ehlers.,  11/07/2013, 10:26 AM

## 2018-09-11 DIAGNOSIS — R69 Illness, unspecified: Secondary | ICD-10-CM | POA: Diagnosis not present

## 2018-11-06 ENCOUNTER — Other Ambulatory Visit (HOSPITAL_BASED_OUTPATIENT_CLINIC_OR_DEPARTMENT_OTHER): Payer: Self-pay | Admitting: Family Medicine

## 2018-11-06 DIAGNOSIS — Z79899 Other long term (current) drug therapy: Secondary | ICD-10-CM | POA: Diagnosis not present

## 2018-11-06 DIAGNOSIS — Z136 Encounter for screening for cardiovascular disorders: Secondary | ICD-10-CM

## 2018-11-06 DIAGNOSIS — R238 Other skin changes: Secondary | ICD-10-CM | POA: Diagnosis not present

## 2018-11-06 DIAGNOSIS — Z23 Encounter for immunization: Secondary | ICD-10-CM | POA: Diagnosis not present

## 2018-11-06 DIAGNOSIS — Z125 Encounter for screening for malignant neoplasm of prostate: Secondary | ICD-10-CM | POA: Diagnosis not present

## 2018-11-06 DIAGNOSIS — M109 Gout, unspecified: Secondary | ICD-10-CM | POA: Diagnosis not present

## 2018-11-06 DIAGNOSIS — I10 Essential (primary) hypertension: Secondary | ICD-10-CM | POA: Diagnosis not present

## 2018-11-06 DIAGNOSIS — Z0000 Encounter for general adult medical examination without abnormal findings: Secondary | ICD-10-CM | POA: Diagnosis not present

## 2018-11-06 DIAGNOSIS — E7800 Pure hypercholesterolemia, unspecified: Secondary | ICD-10-CM | POA: Diagnosis not present

## 2018-11-13 ENCOUNTER — Ambulatory Visit (HOSPITAL_BASED_OUTPATIENT_CLINIC_OR_DEPARTMENT_OTHER)
Admission: RE | Admit: 2018-11-13 | Discharge: 2018-11-13 | Disposition: A | Discharge status: Home / Self Care | Payer: Medicare HMO | Source: Ambulatory Visit | Attending: Family Medicine | Admitting: Family Medicine

## 2018-11-13 ENCOUNTER — Other Ambulatory Visit: Payer: Self-pay

## 2018-11-13 DIAGNOSIS — Z79899 Other long term (current) drug therapy: Secondary | ICD-10-CM | POA: Insufficient documentation

## 2018-11-13 DIAGNOSIS — I10 Essential (primary) hypertension: Secondary | ICD-10-CM | POA: Diagnosis not present

## 2018-11-13 DIAGNOSIS — M109 Gout, unspecified: Secondary | ICD-10-CM | POA: Insufficient documentation

## 2018-11-13 DIAGNOSIS — Z136 Encounter for screening for cardiovascular disorders: Secondary | ICD-10-CM

## 2018-11-13 DIAGNOSIS — Z87891 Personal history of nicotine dependence: Secondary | ICD-10-CM | POA: Insufficient documentation

## 2018-11-13 DIAGNOSIS — I77811 Abdominal aortic ectasia: Secondary | ICD-10-CM | POA: Diagnosis not present

## 2019-01-11 DIAGNOSIS — R69 Illness, unspecified: Secondary | ICD-10-CM | POA: Diagnosis not present

## 2019-01-22 DIAGNOSIS — M109 Gout, unspecified: Secondary | ICD-10-CM | POA: Diagnosis not present

## 2019-01-22 DIAGNOSIS — Z833 Family history of diabetes mellitus: Secondary | ICD-10-CM | POA: Diagnosis not present

## 2019-01-22 DIAGNOSIS — Z803 Family history of malignant neoplasm of breast: Secondary | ICD-10-CM | POA: Diagnosis not present

## 2019-01-22 DIAGNOSIS — Z809 Family history of malignant neoplasm, unspecified: Secondary | ICD-10-CM | POA: Diagnosis not present

## 2019-01-22 DIAGNOSIS — Z7722 Contact with and (suspected) exposure to environmental tobacco smoke (acute) (chronic): Secondary | ICD-10-CM | POA: Diagnosis not present

## 2019-01-22 DIAGNOSIS — I10 Essential (primary) hypertension: Secondary | ICD-10-CM | POA: Diagnosis not present

## 2019-01-22 DIAGNOSIS — Z6831 Body mass index (BMI) 31.0-31.9, adult: Secondary | ICD-10-CM | POA: Diagnosis not present

## 2019-01-22 DIAGNOSIS — E669 Obesity, unspecified: Secondary | ICD-10-CM | POA: Diagnosis not present

## 2019-01-22 DIAGNOSIS — Z87891 Personal history of nicotine dependence: Secondary | ICD-10-CM | POA: Diagnosis not present

## 2019-04-17 ENCOUNTER — Other Ambulatory Visit: Payer: Medicare HMO

## 2019-05-24 DIAGNOSIS — R69 Illness, unspecified: Secondary | ICD-10-CM | POA: Diagnosis not present

## 2019-10-11 DIAGNOSIS — R69 Illness, unspecified: Secondary | ICD-10-CM | POA: Diagnosis not present

## 2019-10-29 DIAGNOSIS — H60331 Swimmer's ear, right ear: Secondary | ICD-10-CM | POA: Diagnosis not present

## 2019-11-07 DIAGNOSIS — I77811 Abdominal aortic ectasia: Secondary | ICD-10-CM | POA: Diagnosis not present

## 2019-11-07 DIAGNOSIS — I10 Essential (primary) hypertension: Secondary | ICD-10-CM | POA: Diagnosis not present

## 2019-11-07 DIAGNOSIS — Z125 Encounter for screening for malignant neoplasm of prostate: Secondary | ICD-10-CM | POA: Diagnosis not present

## 2019-11-07 DIAGNOSIS — Z0000 Encounter for general adult medical examination without abnormal findings: Secondary | ICD-10-CM | POA: Diagnosis not present

## 2019-11-07 DIAGNOSIS — Z23 Encounter for immunization: Secondary | ICD-10-CM | POA: Diagnosis not present

## 2019-11-07 DIAGNOSIS — M109 Gout, unspecified: Secondary | ICD-10-CM | POA: Diagnosis not present

## 2019-11-07 DIAGNOSIS — E7800 Pure hypercholesterolemia, unspecified: Secondary | ICD-10-CM | POA: Diagnosis not present

## 2019-11-07 DIAGNOSIS — R748 Abnormal levels of other serum enzymes: Secondary | ICD-10-CM | POA: Diagnosis not present

## 2019-11-26 DIAGNOSIS — Z803 Family history of malignant neoplasm of breast: Secondary | ICD-10-CM | POA: Diagnosis not present

## 2019-11-26 DIAGNOSIS — I10 Essential (primary) hypertension: Secondary | ICD-10-CM | POA: Diagnosis not present

## 2019-11-26 DIAGNOSIS — E669 Obesity, unspecified: Secondary | ICD-10-CM | POA: Diagnosis not present

## 2019-11-26 DIAGNOSIS — Z8249 Family history of ischemic heart disease and other diseases of the circulatory system: Secondary | ICD-10-CM | POA: Diagnosis not present

## 2019-11-26 DIAGNOSIS — Z6830 Body mass index (BMI) 30.0-30.9, adult: Secondary | ICD-10-CM | POA: Diagnosis not present

## 2019-11-26 DIAGNOSIS — Z825 Family history of asthma and other chronic lower respiratory diseases: Secondary | ICD-10-CM | POA: Diagnosis not present

## 2019-11-26 DIAGNOSIS — Z7722 Contact with and (suspected) exposure to environmental tobacco smoke (acute) (chronic): Secondary | ICD-10-CM | POA: Diagnosis not present

## 2019-11-26 DIAGNOSIS — M109 Gout, unspecified: Secondary | ICD-10-CM | POA: Diagnosis not present

## 2019-11-26 DIAGNOSIS — Z833 Family history of diabetes mellitus: Secondary | ICD-10-CM | POA: Diagnosis not present

## 2019-11-26 DIAGNOSIS — Z008 Encounter for other general examination: Secondary | ICD-10-CM | POA: Diagnosis not present

## 2019-11-26 DIAGNOSIS — Z87891 Personal history of nicotine dependence: Secondary | ICD-10-CM | POA: Diagnosis not present

## 2020-01-30 DIAGNOSIS — R69 Illness, unspecified: Secondary | ICD-10-CM | POA: Diagnosis not present

## 2020-07-24 DIAGNOSIS — Z8249 Family history of ischemic heart disease and other diseases of the circulatory system: Secondary | ICD-10-CM | POA: Diagnosis not present

## 2020-07-24 DIAGNOSIS — Z6831 Body mass index (BMI) 31.0-31.9, adult: Secondary | ICD-10-CM | POA: Diagnosis not present

## 2020-07-24 DIAGNOSIS — Z809 Family history of malignant neoplasm, unspecified: Secondary | ICD-10-CM | POA: Diagnosis not present

## 2020-07-24 DIAGNOSIS — Z833 Family history of diabetes mellitus: Secondary | ICD-10-CM | POA: Diagnosis not present

## 2020-07-24 DIAGNOSIS — I10 Essential (primary) hypertension: Secondary | ICD-10-CM | POA: Diagnosis not present

## 2020-07-24 DIAGNOSIS — E669 Obesity, unspecified: Secondary | ICD-10-CM | POA: Diagnosis not present

## 2020-07-24 DIAGNOSIS — M109 Gout, unspecified: Secondary | ICD-10-CM | POA: Diagnosis not present

## 2020-07-24 DIAGNOSIS — T7840XD Allergy, unspecified, subsequent encounter: Secondary | ICD-10-CM | POA: Diagnosis not present

## 2020-07-24 DIAGNOSIS — Z87891 Personal history of nicotine dependence: Secondary | ICD-10-CM | POA: Diagnosis not present

## 2020-11-06 DIAGNOSIS — M109 Gout, unspecified: Secondary | ICD-10-CM | POA: Diagnosis not present

## 2020-11-06 DIAGNOSIS — Z125 Encounter for screening for malignant neoplasm of prostate: Secondary | ICD-10-CM | POA: Diagnosis not present

## 2020-11-06 DIAGNOSIS — E7800 Pure hypercholesterolemia, unspecified: Secondary | ICD-10-CM | POA: Diagnosis not present

## 2020-11-06 DIAGNOSIS — I10 Essential (primary) hypertension: Secondary | ICD-10-CM | POA: Diagnosis not present

## 2020-11-13 DIAGNOSIS — I10 Essential (primary) hypertension: Secondary | ICD-10-CM | POA: Diagnosis not present

## 2020-11-13 DIAGNOSIS — M109 Gout, unspecified: Secondary | ICD-10-CM | POA: Diagnosis not present

## 2020-11-13 DIAGNOSIS — I77811 Abdominal aortic ectasia: Secondary | ICD-10-CM | POA: Diagnosis not present

## 2020-11-13 DIAGNOSIS — R7301 Impaired fasting glucose: Secondary | ICD-10-CM | POA: Diagnosis not present

## 2020-11-13 DIAGNOSIS — Z125 Encounter for screening for malignant neoplasm of prostate: Secondary | ICD-10-CM | POA: Diagnosis not present

## 2020-11-13 DIAGNOSIS — E7800 Pure hypercholesterolemia, unspecified: Secondary | ICD-10-CM | POA: Diagnosis not present

## 2020-11-13 DIAGNOSIS — Z0000 Encounter for general adult medical examination without abnormal findings: Secondary | ICD-10-CM | POA: Diagnosis not present

## 2021-01-20 IMAGING — US US ABDOMINAL AORTA SCREENING AAA
1 series · 14 of 25 positions shown · non-contrast
Comparison: None.

CLINICAL DATA: AAA screening

EXAM:
ULTRASOUND OF ABDOMINAL AORTA
TECHNIQUE: Ultrasound examination of the abdominal aorta and proximal common
iliac arteries was performed to evaluate for aneurysm. Additional
color and Doppler images of the distal aorta were obtained to
document patency.

[Series 1: us abdominal aorta screening aaa · 14 of 37 slices shown]
[im 1/37]
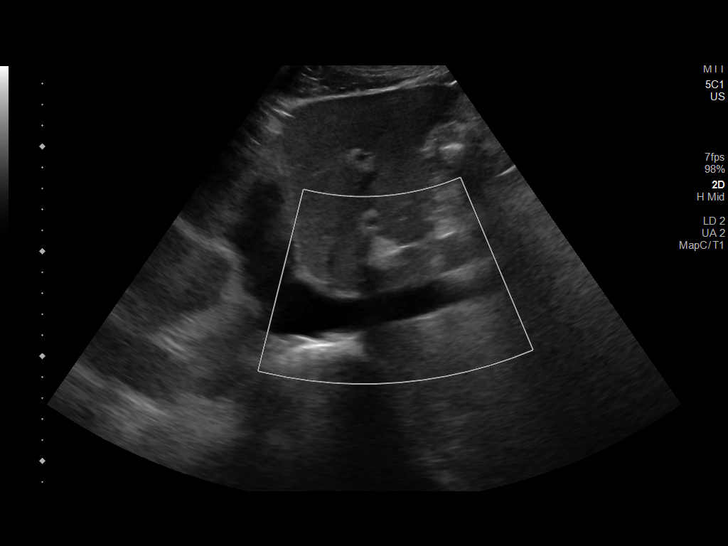
[im 4/37]
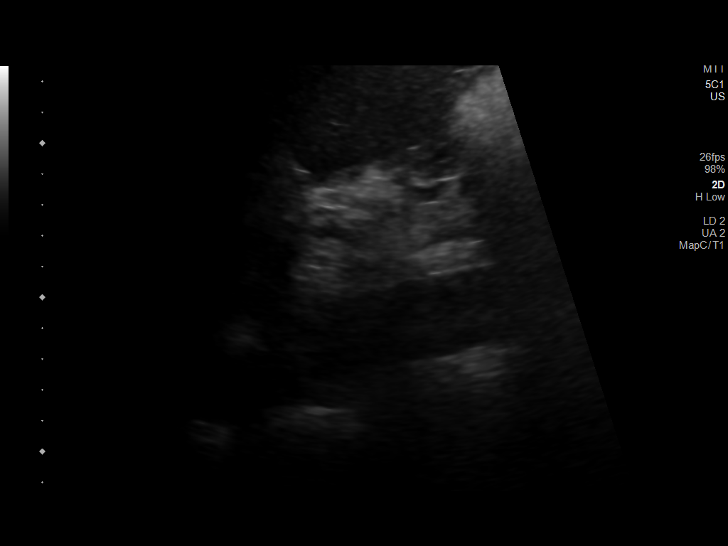
[im 7/37]
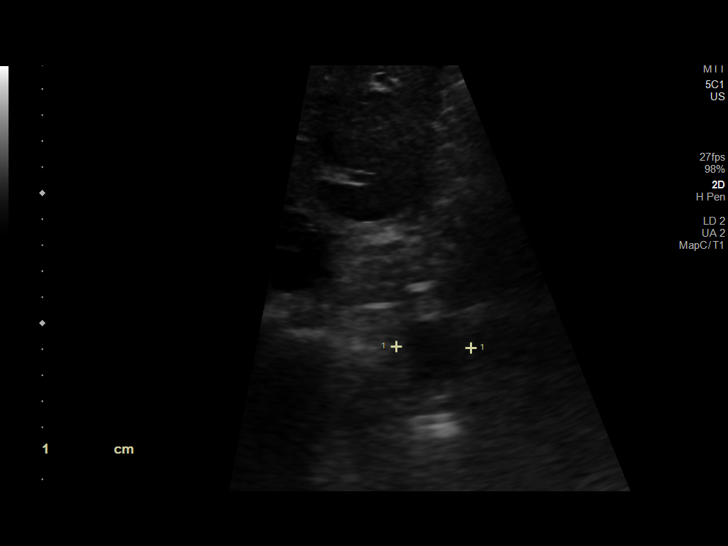
[im 10/37]
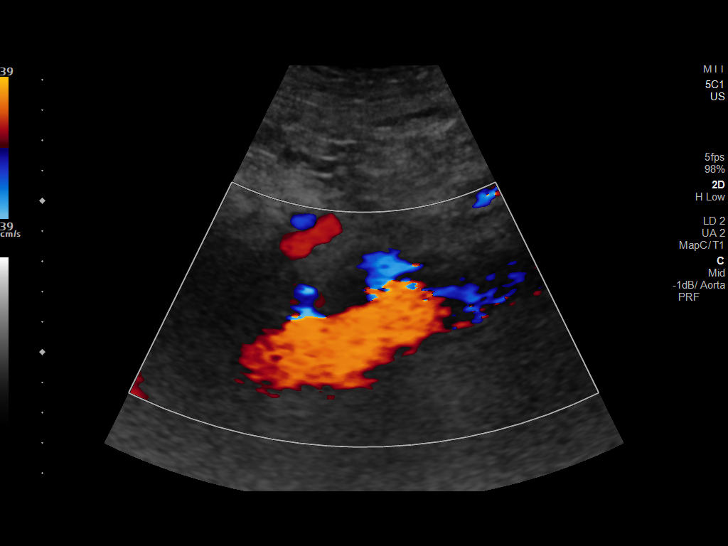
[im 13/37]
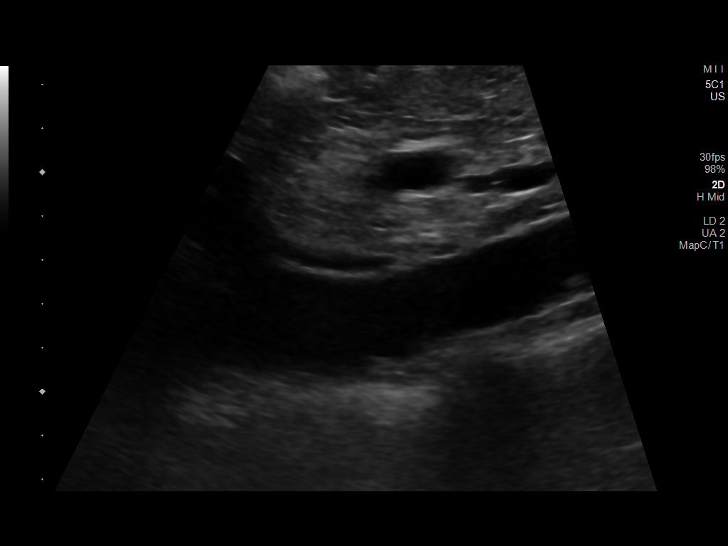
[im 14/37]
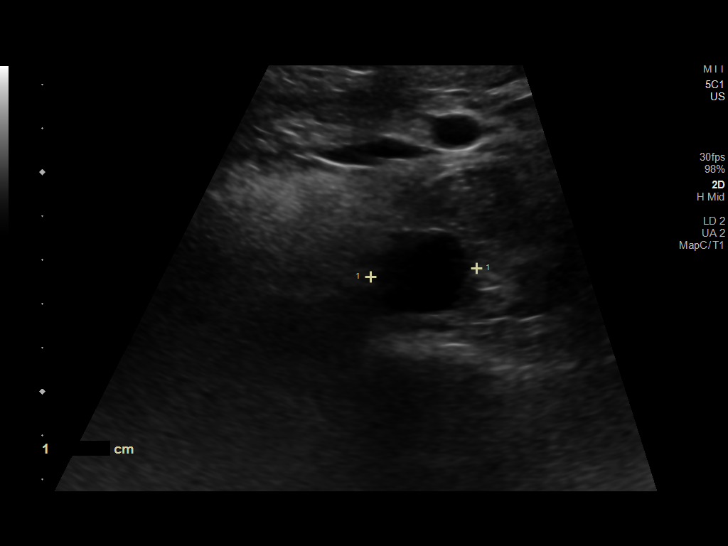
[im 17/37]
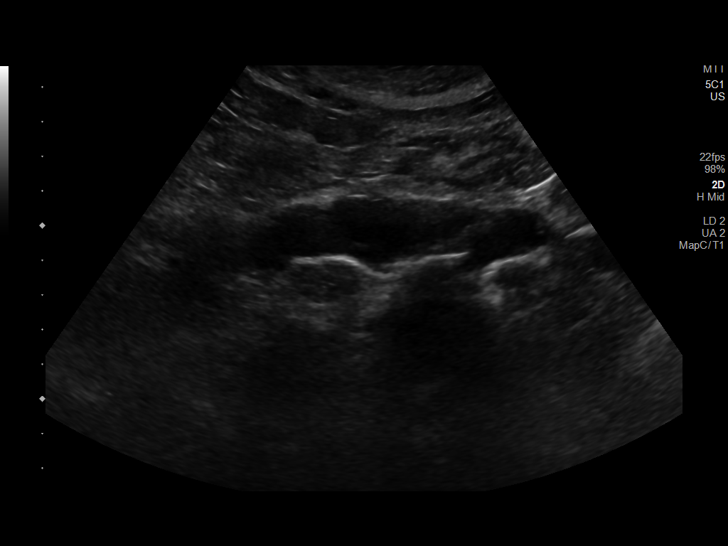
[im 20/37]
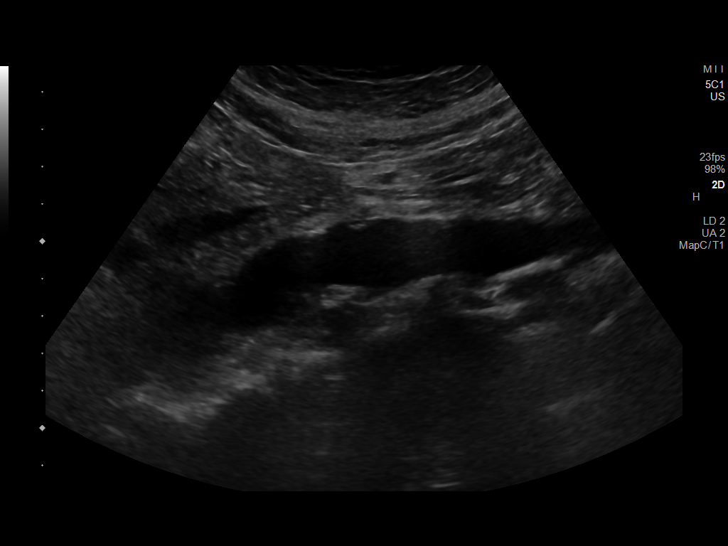
[im 23/37]
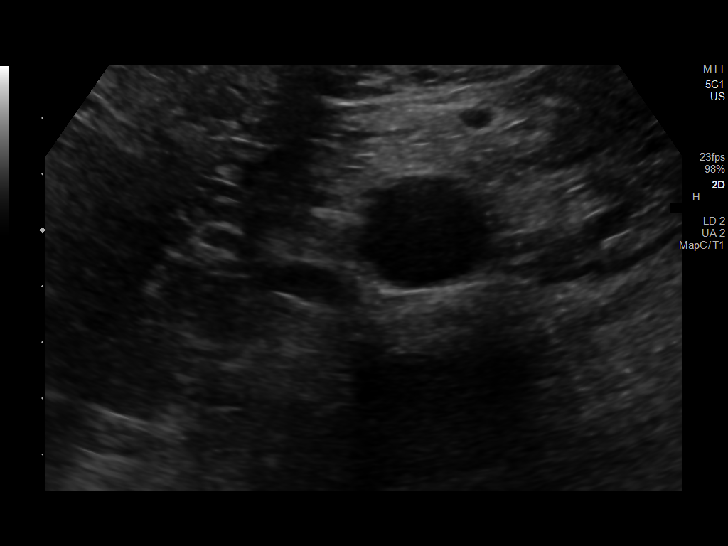
[im 25/37]
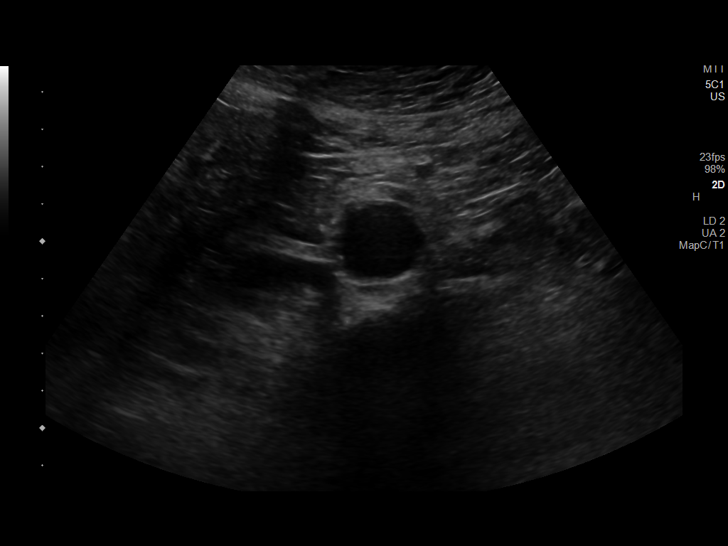
[im 28/37]
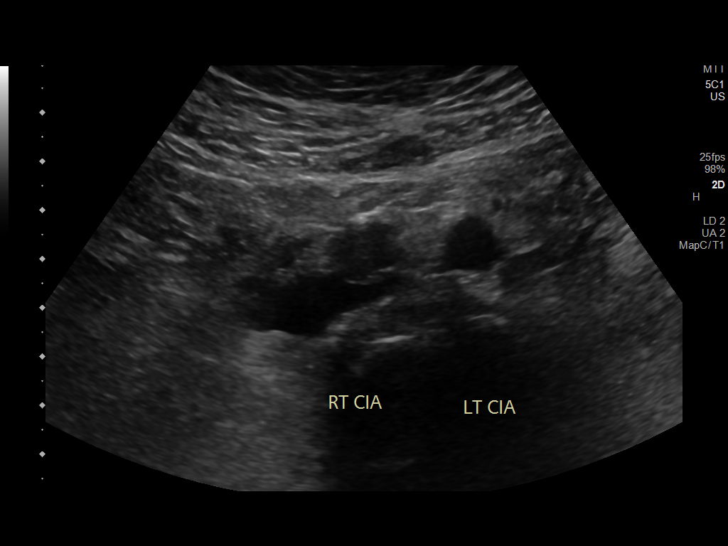
[im 31/37]
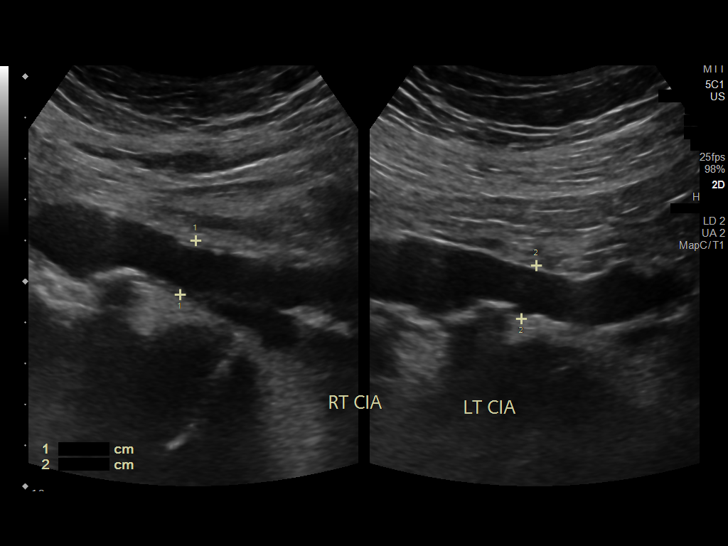
[im 34/37]
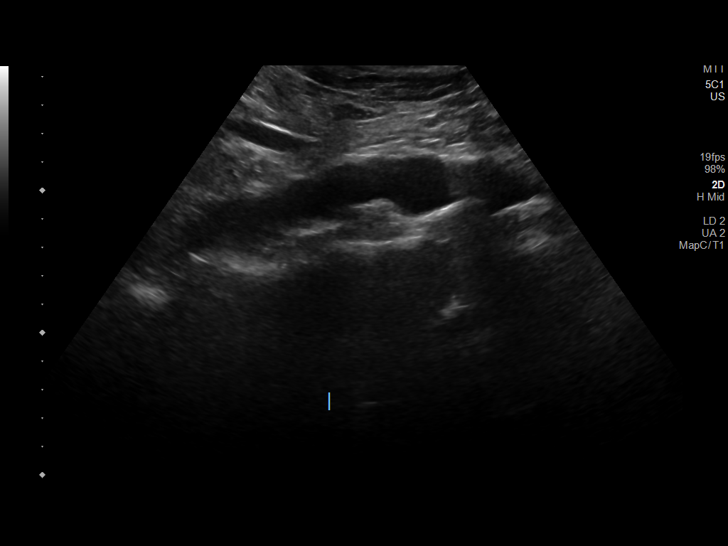
[im 37/37]
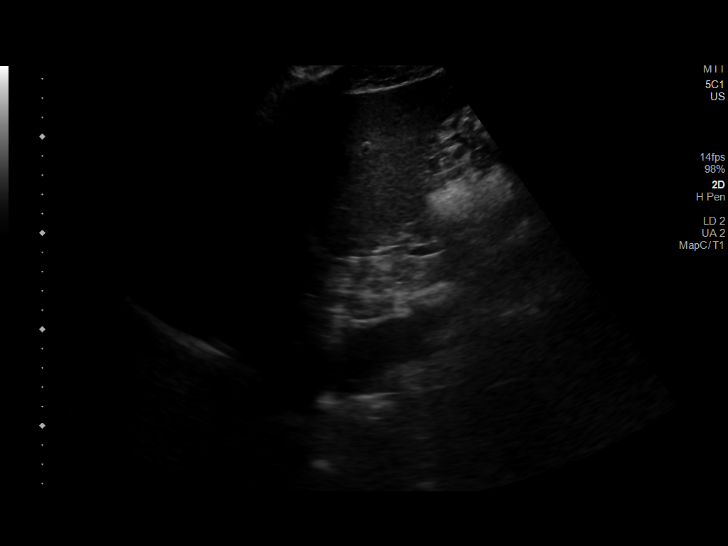

[14 of 25 positions shown; findings below may reference images not displayed]

FINDINGS: Abdominal aortic measurements as follows:

Proximal:  2.9 x 2.9 cm

Mid:  2.1 x 2.4 cm

Distal:  2.6 x 2.7 cm
Patent: Yes. Scattered echogenic, shadowing areas suggesting
calcified atherosclerotic plaques.

Right common iliac artery: 1.4 cm

Left common iliac artery: 1.4 cm
IMPRESSION: Ectatic abdominal aorta at risk for aneurysm development. Recommend
followup by ultrasound in 5 years. This recommendation follows ACR
consensus guidelines: White Paper of the ACR Incidental Findings
Committee II on Vascular Findings. [HOSPITAL] 1015;

Aortic aneurysm NOS ([id number]-YF9.3)

## 2021-05-28 DIAGNOSIS — Z87891 Personal history of nicotine dependence: Secondary | ICD-10-CM | POA: Diagnosis not present

## 2021-05-28 DIAGNOSIS — M109 Gout, unspecified: Secondary | ICD-10-CM | POA: Diagnosis not present

## 2021-05-28 DIAGNOSIS — G8929 Other chronic pain: Secondary | ICD-10-CM | POA: Diagnosis not present

## 2021-05-28 DIAGNOSIS — R69 Illness, unspecified: Secondary | ICD-10-CM | POA: Diagnosis not present

## 2021-05-28 DIAGNOSIS — E785 Hyperlipidemia, unspecified: Secondary | ICD-10-CM | POA: Diagnosis not present

## 2021-05-28 DIAGNOSIS — Z6831 Body mass index (BMI) 31.0-31.9, adult: Secondary | ICD-10-CM | POA: Diagnosis not present

## 2021-05-28 DIAGNOSIS — Z791 Long term (current) use of non-steroidal anti-inflammatories (NSAID): Secondary | ICD-10-CM | POA: Diagnosis not present

## 2021-05-28 DIAGNOSIS — Z8249 Family history of ischemic heart disease and other diseases of the circulatory system: Secondary | ICD-10-CM | POA: Diagnosis not present

## 2021-05-28 DIAGNOSIS — Z008 Encounter for other general examination: Secondary | ICD-10-CM | POA: Diagnosis not present

## 2021-05-28 DIAGNOSIS — M1990 Unspecified osteoarthritis, unspecified site: Secondary | ICD-10-CM | POA: Diagnosis not present

## 2021-05-28 DIAGNOSIS — Z803 Family history of malignant neoplasm of breast: Secondary | ICD-10-CM | POA: Diagnosis not present

## 2021-05-28 DIAGNOSIS — I10 Essential (primary) hypertension: Secondary | ICD-10-CM | POA: Diagnosis not present

## 2021-05-28 DIAGNOSIS — E669 Obesity, unspecified: Secondary | ICD-10-CM | POA: Diagnosis not present

## 2021-11-20 DIAGNOSIS — I10 Essential (primary) hypertension: Secondary | ICD-10-CM | POA: Diagnosis not present

## 2021-11-20 DIAGNOSIS — E7800 Pure hypercholesterolemia, unspecified: Secondary | ICD-10-CM | POA: Diagnosis not present

## 2021-11-20 DIAGNOSIS — R7301 Impaired fasting glucose: Secondary | ICD-10-CM | POA: Diagnosis not present

## 2021-11-20 DIAGNOSIS — Z125 Encounter for screening for malignant neoplasm of prostate: Secondary | ICD-10-CM | POA: Diagnosis not present

## 2021-11-20 DIAGNOSIS — M109 Gout, unspecified: Secondary | ICD-10-CM | POA: Diagnosis not present

## 2021-11-25 ENCOUNTER — Other Ambulatory Visit: Payer: Self-pay | Admitting: Family Medicine

## 2021-11-25 ENCOUNTER — Other Ambulatory Visit (HOSPITAL_BASED_OUTPATIENT_CLINIC_OR_DEPARTMENT_OTHER): Payer: Self-pay | Admitting: Family Medicine

## 2021-11-25 DIAGNOSIS — Z23 Encounter for immunization: Secondary | ICD-10-CM | POA: Diagnosis not present

## 2021-11-25 DIAGNOSIS — M109 Gout, unspecified: Secondary | ICD-10-CM | POA: Diagnosis not present

## 2021-11-25 DIAGNOSIS — E7800 Pure hypercholesterolemia, unspecified: Secondary | ICD-10-CM | POA: Diagnosis not present

## 2021-11-25 DIAGNOSIS — Z6830 Body mass index (BMI) 30.0-30.9, adult: Secondary | ICD-10-CM | POA: Diagnosis not present

## 2021-11-25 DIAGNOSIS — Z125 Encounter for screening for malignant neoplasm of prostate: Secondary | ICD-10-CM | POA: Diagnosis not present

## 2021-11-25 DIAGNOSIS — I77811 Abdominal aortic ectasia: Secondary | ICD-10-CM | POA: Diagnosis not present

## 2021-11-25 DIAGNOSIS — L570 Actinic keratosis: Secondary | ICD-10-CM | POA: Diagnosis not present

## 2021-11-25 DIAGNOSIS — Z0000 Encounter for general adult medical examination without abnormal findings: Secondary | ICD-10-CM | POA: Diagnosis not present

## 2021-11-25 DIAGNOSIS — I10 Essential (primary) hypertension: Secondary | ICD-10-CM | POA: Diagnosis not present

## 2021-12-30 ENCOUNTER — Ambulatory Visit (HOSPITAL_BASED_OUTPATIENT_CLINIC_OR_DEPARTMENT_OTHER)
Admission: RE | Admit: 2021-12-30 | Discharge: 2021-12-30 | Disposition: A | Discharge status: Home / Self Care | Payer: Medicare HMO | Source: Ambulatory Visit | Attending: Family Medicine | Admitting: Family Medicine

## 2021-12-30 DIAGNOSIS — Z0000 Encounter for general adult medical examination without abnormal findings: Secondary | ICD-10-CM | POA: Insufficient documentation

## 2022-01-12 DIAGNOSIS — J302 Other seasonal allergic rhinitis: Secondary | ICD-10-CM | POA: Insufficient documentation

## 2022-01-12 DIAGNOSIS — I10 Essential (primary) hypertension: Secondary | ICD-10-CM | POA: Insufficient documentation

## 2022-01-12 DIAGNOSIS — Z973 Presence of spectacles and contact lenses: Secondary | ICD-10-CM | POA: Insufficient documentation

## 2022-01-12 NOTE — Progress Notes (Signed)
Cardiology Office Note:    Date:  01/13/2022   ID:  Dennis Tate, DOB 24-Nov-1955, MRN [id number]  PCP:  Lawerance Cruel, MD  Cardiologist:  Shirlee More, MD   Referring MD: Lawerance Cruel, MD  ASSESSMENT:    1. Agatston coronary artery calcium score greater than 400   2. Ascending aorta enlargement (HCC)   3. Aortic valve calcification   4. Primary hypertension   5. Precordial pain    PLAN:    In order of problems listed above:  In general coronary calcium score is a test to decide whether lipid-lowering therapy is indicated.  Dennis Tate is a score high but it is extremely high.  Typically a coronary calcium score of 300 is considered to be a CAD equivalent and notably has a score very high but is localized to the left anterior descending coronary artery.  In this case despite the absence of symptoms he will undergo a cardiac CTA to define the presence or absence of flow-limiting stenosis and also a more definitive evaluation of his aorta.  He will initiate lipid-lowering therapy with a high intensity statin.  He will be screened today for LP(a) Continue his current antihypertensive and trend home blood pressures Echocardiogram with aortic valve calcification  Next appointment 3 months   Medication Adjustments/Labs and Tests Ordered: Current medicines are reviewed at length with the patient today.  Concerns regarding medicines are outlined above.  No orders of the defined types were placed in this encounter.  No orders of the defined types were placed in this encounter.    Chief complaint, I had an abnormal cardiac coronary calcium score  History of Present Illness:    Dennis Tate is a 66 y.o. male with a history of hypertension and previous ectasia of his aorta  who is being seen today for the evaluation of coronary atherosclerosis and a dilated aortic root on CT at the request of Lawerance Cruel, MD.  Coronary CTA 12/30/2021 showed extremely elevated coronary  calcium score 2709 97th percentile with moderate aortic valve calcification aortic valve calcium score of 1199 and mild dilation of the ascending aorta 43 mm.  The predominance of his coronary calcification was left anterior descending coronary artery  Medicare screening duplex abdominal aorta performed 2020 showed ectasia of the abdominal aorta but no presence of aneurysm.  He is very health-conscious active exercises daily and has had no indication symptomatically of heart disease Has no history of congenital rheumatic heart disease or atrial fibrillation He has had no chest pain shortness of breath palpitation or syncope His father had heart attack before the age of 43 and a brother younger than him has had multiple strokes but no known familial lipid disorder. He is shocked by his very high coronary artery calcium score and atherosclerosis. Curiously his wife within the last few weeks had a myocardial infarction and coronary angiography. Past Medical History:  Diagnosis Date   Hypertension    Seasonal allergies    Wears glasses     Past Surgical History:  Procedure Laterality Date   COLONOSCOPY     I & D EXTREMITY  03/14/2012   Procedure: IRRIGATION AND DEBRIDEMENT EXTREMITY;  Surgeon: Wynonia Sours, MD;  Location: [facility];  Service: Orthopedics;  Laterality: Right;  I & D, PACKING OLECRANON BURSAE RIGHT   OLECRANON BURSECTOMY  11/17/2011   Procedure: OLECRANON BURSA;  Surgeon: Wynonia Sours, MD;  Location: [facility];  Service: Orthopedics;  Laterality:  Right;  EXCISION OLECRANON BURSA ON RIGHT   WISDOM TOOTH EXTRACTION      Current Medications: Current Meds  Medication Sig   allopurinol (ZYLOPRIM) 300 MG tablet Take 300 mg by mouth daily.   fexofenadine (ALLEGRA) 180 MG tablet Take 180 mg by mouth as needed.   indomethacin (INDOCIN) 50 MG capsule Take 50 mg by mouth 2 (two) times daily as needed (pain. (gout flare up)).   lisinopril  (PRINIVIL,ZESTRIL) 10 MG tablet Take 10 mg by mouth daily.     Allergies:   Patient has no known allergies.   Social History   Socioeconomic History   Marital status: Married    Spouse name: Not on file   Number of children: Not on file   Years of education: Not on file   Highest education level: Not on file  Occupational History   Not on file  Tobacco Use   Smoking status: Former    Types: Cigarettes    Quit date: 11/15/2004    Years since quitting: 17.1    Passive exposure: Past   Smokeless tobacco: Not on file  Vaping Use   Vaping Use: Never used  Substance and Sexual Activity   Alcohol use: Yes    Comment: occ   Drug use: No   Sexual activity: Not on file  Other Topics Concern   Not on file  Social History Narrative   Not on file   Social Determinants of Health   Financial Resource Strain: Not on file  Food Insecurity: Not on file  Transportation Needs: Not on file  Physical Activity: Not on file  Stress: Not on file  Social Connections: Not on file     Family History: The patient's family history includes ADD / ADHD in his brother; Autoimmune disease in his father; Breast cancer (age of onset: 25) in his mother; Bronchopulmonary dysplasia in his brother; Diabetes in his father; Obesity in his brother; Prostate cancer (age of onset: 26) in his father.  ROS:   ROS Please see the history of present illness.     All other systems reviewed and are negative.  EKGs/Labs/Other Studies Reviewed:    The following studies were reviewed today:  EKG done in my office today shows sinus rhythm 1 PVC and otherwise normal  Recent Labs: Lipid profile 11/20/2021 cholesterol 193 LDL 123 triglycerides 134 A1c 5.5 hemoglobin 13.8 creatinine 1.12  Physical Exam:    VS:  BP (!) 160/103 (BP Location: Right Arm, Patient Position: Sitting)   Pulse 88   Ht 5\' 11"  (1.803 m)   Wt 233 lb 3.2 oz (105.8 kg)   SpO2 95%   BMI 32.52 kg/m     Wt Readings from Last 3 Encounters:   01/13/22 233 lb 3.2 oz (105.8 kg)  11/07/13 195 lb 6.4 oz (88.6 kg)  10/22/13 201 lb (91.2 kg)     GEN:  Well nourished, well developed in no acute distress HEENT: Normal NECK: No JVD; No carotid bruits LYMPHATICS: No lymphadenopathy CARDIAC: RRR, no murmurs, rubs, gallops RESPIRATORY:  Clear to auscultation without rales, wheezing or rhonchi  ABDOMEN: Soft, non-tender, non-distended MUSCULOSKELETAL:  No edema; No deformity  SKIN: Warm and dry NEUROLOGIC:  Alert and oriented x 3 PSYCHIATRIC:  Normal affect     Signed, Shirlee More, MD  01/13/2022 11:36 AM    [place]

## 2022-01-13 ENCOUNTER — Encounter: Payer: Self-pay | Admitting: Cardiology

## 2022-01-13 ENCOUNTER — Ambulatory Visit: Payer: Medicare HMO | Attending: Cardiology | Admitting: Cardiology

## 2022-01-13 VITALS — BP 160/103 | HR 88 | Ht 71.0 in | Wt 233.2 lb

## 2022-01-13 DIAGNOSIS — R931 Abnormal findings on diagnostic imaging of heart and coronary circulation: Secondary | ICD-10-CM | POA: Diagnosis not present

## 2022-01-13 DIAGNOSIS — I359 Nonrheumatic aortic valve disorder, unspecified: Secondary | ICD-10-CM

## 2022-01-13 DIAGNOSIS — I10 Essential (primary) hypertension: Secondary | ICD-10-CM | POA: Diagnosis not present

## 2022-01-13 DIAGNOSIS — R072 Precordial pain: Secondary | ICD-10-CM

## 2022-01-13 DIAGNOSIS — I7789 Other specified disorders of arteries and arterioles: Secondary | ICD-10-CM | POA: Diagnosis not present

## 2022-01-13 MED ORDER — METOPROLOL TARTRATE 100 MG PO TABS: ORAL_TABLET | ORAL | 0 refills | Status: DC

## 2022-01-13 MED ORDER — ROSUVASTATIN CALCIUM 20 MG PO TABS: 20.0000 mg | ORAL_TABLET | Freq: Every day | ORAL | 3 refills | Status: DC

## 2022-01-13 NOTE — Patient Instructions (Addendum)
Medication Instructions:   Take: Metoprolol 100mg  1 tablet 2 hours prior to CT Scan Start: Rosuvastatin 20mg  1 tablet daily  *If you need a refill on your cardiac medications before your next appointment, please call your pharmacy*   Lab Work: Lpa, BMP - today    Testing/Procedures:   Your cardiac CT will be scheduled at one of the below locations:   [facility] [address] [place], [place] [address] [phone]   At [facility], please arrive at the [facility] and Children[facility] Entrance (Entrance C2) of [facility] 30 minutes prior to test start time. You can use the FREE valet parking offered at entrance C (encouraged to control the heart rate for the test)  Proceed to the [facility] Radiology Department (first floor) to check-in and test prep.  All radiology patients and guests should use entrance C2 at [facility], accessed from [facility], even though the hospital's physical address listed is [address].      Please follow these instructions carefully (unless otherwise directed):  Hold all erectile dysfunction medications at least 3 days (72 hrs) prior to test.  On the Night Before the Test: Be sure to Drink plenty of water. Do not consume any caffeinated/decaffeinated beverages or chocolate 12 hours prior to your test. Do not take any antihistamines 12 hours prior to your test.  On the Day of the Test: Drink plenty of water until 1 hour prior to the test. Do not eat any food 4 hours prior to the test. You may take your regular medications prior to the test.  Take metoprolol (Lopressor) two hours prior to test.       After the Test: Drink plenty of water. After receiving IV contrast, you may experience a mild flushed feeling. This is normal. On occasion, you may experience a mild rash up to 24 hours after the test. This is not dangerous. If this occurs, you can take Benadryl 25 mg and increase your  fluid intake. If you experience trouble breathing, this can be serious. If it is severe call 911 IMMEDIATELY. If it is mild, please call our office. If you take any of these medications: Glipizide/Metformin, Avandament, Glucavance, please do not take 48 hours after completing test unless otherwise instructed.  We will call to schedule your test 2-4 weeks out understanding that some insurance companies will need an authorization prior to the service being performed.   For non-scheduling related questions, please contact the cardiac imaging nurse navigator should you have any questions/concerns: Marchia Bond, Cardiac Imaging Nurse Navigator Gordy Clement, Cardiac Imaging Nurse Navigator [facility] Direct Office Dial: [phone]   For scheduling needs, including cancellations and rescheduling, please call [place], [phone].    Follow-Up: At [facility], you and your health needs are our priority.  As part of our continuing mission to provide you with exceptional heart care, we have created designated Provider Care Teams.  These Care Teams include your primary Cardiologist (physician) and Advanced Practice Providers (APPs -  Physician Assistants and Nurse Practitioners) who all work together to provide you with the care you need, when you need it.  We recommend signing up for the patient portal called "MyChart".  Sign up information is provided on this After Visit Summary.  MyChart is used to connect with patients for Virtual Visits (Telemedicine).  Patients are able to view lab/test results, encounter notes, upcoming appointments, etc.  Non-urgent messages can be sent to  your provider as well.   To learn more about what you can do with MyChart, go to [email or web address].    Your next appointment:   6 week(s)  The format for your next appointment:   In Person  Provider:   Shirlee More, MD   Other Instructions Cardiac CT Angiogram A cardiac CT  angiogram is a procedure to look at the heart and the area around the heart. It may be done to help find the cause of chest pains or other symptoms of heart disease. During this procedure, a substance called contrast dye is injected into the blood vessels in the area to be checked. A large X-ray machine, called a CT scanner, then takes detailed pictures of the heart and the surrounding area. The procedure is also sometimes called a coronary CT angiogram, coronary artery scanning, or CTA. A cardiac CT angiogram allows the health care provider to see how well blood is flowing to and from the heart. The health care provider will be able to see if there are any problems, such as: Blockage or narrowing of the coronary arteries in the heart. Fluid around the heart. Signs of weakness or disease in the muscles, valves, and tissues of the heart. Tell a health care provider about: Any allergies you have. This is especially important if you have had a previous allergic reaction to contrast dye. All medicines you are taking, including vitamins, herbs, eye drops, creams, and over-the-counter medicines. Any blood disorders you have. Any surgeries you have had. Any medical conditions you have. Whether you are pregnant or may be pregnant. Any anxiety disorders, chronic pain, or other conditions you have that may increase your stress or prevent you from lying still. What are the risks? Generally, this is a safe procedure. However, problems may occur, including: Bleeding. Infection. Allergic reactions to medicines or dyes. Damage to other structures or organs. Kidney damage from the contrast dye that is used. Increased risk of cancer from radiation exposure. This risk is low. Talk with your health care provider about: The risks and benefits of testing. How you can receive the lowest dose of radiation. What happens before the procedure? Wear comfortable clothing and remove any jewelry, glasses, dentures, and  hearing aids. Follow instructions from your health care provider about eating and drinking. This may include: For 12 hours before the procedure -- avoid caffeine. This includes tea, coffee, soda, energy drinks, and diet pills. Drink plenty of water or other fluids that do not have caffeine in them. Being well hydrated can prevent complications. For 4-6 hours before the procedure -- stop eating and drinking. The contrast dye can cause nausea, but this is less likely if your stomach is empty. Ask your health care provider about changing or stopping your regular medicines. This is especially important if you are taking diabetes medicines, blood thinners, or medicines to treat problems with erections (erectile dysfunction). What happens during the procedure?  Hair on your chest may need to be removed so that small sticky patches called electrodes can be placed on your chest. These will transmit information that helps to monitor your heart during the procedure. An IV will be inserted into one of your veins. You might be given a medicine to control your heart rate during the procedure. This will help to ensure that good images are obtained. You will be asked to lie on an exam table. This table will slide in and out of the CT machine during the procedure. Contrast dye will be injected into  the IV. You might feel warm, or you may get a metallic taste in your mouth. You will be given a medicine called nitroglycerin. This will relax or dilate the arteries in your heart. The table that you are lying on will move into the CT machine tunnel for the scan. The person running the machine will give you instructions while the scans are being done. You may be asked to: Keep your arms above your head. Hold your breath. Stay very still, even if the table is moving. When the scanning is complete, you will be moved out of the machine. The IV will be removed. The procedure may vary among health care providers and  hospitals. What can I expect after the procedure? After your procedure, it is common to have: A metallic taste in your mouth from the contrast dye. A feeling of warmth. A headache from the nitroglycerin. Follow these instructions at home: Take over-the-counter and prescription medicines only as told by your health care provider. If you are told, drink enough fluid to keep your urine pale yellow. This will help to flush the contrast dye out of your body. Most people can return to their normal activities right after the procedure. Ask your health care provider what activities are safe for you. It is up to you to get the results of your procedure. Ask your health care provider, or the department that is doing the procedure, when your results will be ready. Keep all follow-up visits as told by your health care provider. This is important. Contact a health care provider if: You have any symptoms of allergy to the contrast dye. These include: Shortness of breath. Rash or hives. A racing heartbeat. Summary A cardiac CT angiogram is a procedure to look at the heart and the area around the heart. It may be done to help find the cause of chest pains or other symptoms of heart disease. During this procedure, a large X-ray machine, called a CT scanner, takes detailed pictures of the heart and the surrounding area after a contrast dye has been injected into blood vessels in the area. Ask your health care provider about changing or stopping your regular medicines before the procedure. This is especially important if you are taking diabetes medicines, blood thinners, or medicines to treat erectile dysfunction. If you are told, drink enough fluid to keep your urine pale yellow. This will help to flush the contrast dye out of your body. This information is not intended to replace advice given to you by your health care provider. Make sure you discuss any questions you have with your health care  provider. Document Revised: 10/25/2018 Document Reviewed: 10/25/2018 Elsevier Patient Education  2020 [place] physician has requested that you have an echocardiogram. Echocardiography is a painless test that uses sound waves to create images of your heart. It provides your doctor with information about the size and shape of your heart and how well your heart's chambers and valves are working. This procedure takes approximately one hour. There are no restrictions for this procedure. Please do NOT wear cologne, perfume, aftershave, or lotions (deodorant is allowed). Please arrive 15 minutes prior to your appointment time.

## 2022-01-14 LAB — BASIC METABOLIC PANEL
BUN/Creatinine Ratio: 16 (ref 10–24)
BUN: 18 mg/dL (ref 8–27)
CO2: 23 mmol/L (ref 20–29)
Calcium: 9.3 mg/dL (ref 8.6–10.2)
Chloride: 99 mmol/L (ref 96–106)
Creatinine, Ser: 1.14 mg/dL (ref 0.76–1.27)
Glucose: 103 mg/dL — ABNORMAL HIGH (ref 70–99)
Potassium: 4.7 mmol/L (ref 3.5–5.2)
Sodium: 137 mmol/L (ref 134–144)
eGFR: 70 mL/min/{1.73_m2} (ref >59)

## 2022-01-14 LAB — LIPOPROTEIN A (LPA): Lipoprotein (a): 97.7 nmol/L — ABNORMAL HIGH (ref <75.0)

## 2022-01-27 ENCOUNTER — Telehealth (HOSPITAL_COMMUNITY): Payer: Self-pay | Admitting: Emergency Medicine

## 2022-01-27 ENCOUNTER — Ambulatory Visit (HOSPITAL_BASED_OUTPATIENT_CLINIC_OR_DEPARTMENT_OTHER)
Admission: RE | Admit: 2022-01-27 | Discharge: 2022-01-27 | Disposition: A | Discharge status: Home / Self Care | Payer: Medicare HMO | Source: Ambulatory Visit | Attending: Cardiology | Admitting: Cardiology

## 2022-01-27 DIAGNOSIS — I359 Nonrheumatic aortic valve disorder, unspecified: Secondary | ICD-10-CM

## 2022-01-27 DIAGNOSIS — R0609 Other forms of dyspnea: Secondary | ICD-10-CM

## 2022-01-27 DIAGNOSIS — I77819 Aortic ectasia, unspecified site: Secondary | ICD-10-CM

## 2022-01-27 LAB — ECHOCARDIOGRAM COMPLETE
AR max vel: 3.26 cm2
AV Area VTI: 4.13 cm2
AV Area mean vel: 3.07 cm2
AV Mean grad: 4 mmHg
AV Peak grad: 8.2 mmHg
Ao pk vel: 1.43 m/s
Area-P 1/2: 3.72 cm2
Calc EF: 62.6 %
S' Lateral: 2.9 cm
Single Plane A2C EF: 63.2 %
Single Plane A4C EF: 61.5 %

## 2022-01-27 NOTE — Telephone Encounter (Signed)
Reaching out to patient to offer assistance regarding upcoming cardiac imaging study; pt verbalizes understanding of appt date/time, parking situation and where to check in, pre-test NPO status and medications ordered, and verified current allergies; name and call back number provided for further questions should they arise Rockwell Alexandria RN Navigator Cardiac Imaging [facility] [phone] office [phone] cell  100mg  metoprolol 2 hr prior

## 2022-01-28 ENCOUNTER — Ambulatory Visit (HOSPITAL_COMMUNITY)
Admission: RE | Admit: 2022-01-28 | Discharge: 2022-01-28 | Disposition: A | Discharge status: Home / Self Care | Payer: Medicare HMO | Source: Ambulatory Visit | Attending: Cardiology | Admitting: Cardiology

## 2022-01-28 ENCOUNTER — Other Ambulatory Visit: Payer: Self-pay | Admitting: Cardiology

## 2022-01-28 DIAGNOSIS — R931 Abnormal findings on diagnostic imaging of heart and coronary circulation: Secondary | ICD-10-CM

## 2022-01-28 DIAGNOSIS — R072 Precordial pain: Secondary | ICD-10-CM | POA: Diagnosis present

## 2022-01-28 DIAGNOSIS — I2510 Atherosclerotic heart disease of native coronary artery without angina pectoris: Secondary | ICD-10-CM | POA: Diagnosis not present

## 2022-01-28 MED ORDER — NITROGLYCERIN 0.4 MG SL SUBL: 0.8000 mg | SUBLINGUAL_TABLET | Freq: Once | SUBLINGUAL | Status: AC

## 2022-01-28 MED ORDER — METOPROLOL TARTRATE 5 MG/5ML IV SOLN: 10.0000 mg | INTRAVENOUS | Status: DC | PRN

## 2022-01-28 MED ORDER — IOHEXOL 350 MG/ML SOLN
100.0000 mL | Freq: Once | INTRAVENOUS | Status: AC | PRN
  Administered 2022-01-28: 100 mL via INTRAVENOUS

## 2022-01-28 MED ORDER — NITROGLYCERIN 0.4 MG SL SUBL
SUBLINGUAL_TABLET | SUBLINGUAL | Status: AC
  Administered 2022-01-28: 0.8 mg via SUBLINGUAL
  Filled 2022-01-28: qty 2

## 2022-01-29 ENCOUNTER — Ambulatory Visit (HOSPITAL_COMMUNITY)
Admission: RE | Admit: 2022-01-29 | Discharge: 2022-01-29 | Disposition: A | Discharge status: Home / Self Care | Payer: Medicare HMO | Source: Ambulatory Visit | Attending: Cardiology | Admitting: Cardiology

## 2022-01-29 DIAGNOSIS — R931 Abnormal findings on diagnostic imaging of heart and coronary circulation: Secondary | ICD-10-CM

## 2022-01-29 DIAGNOSIS — R072 Precordial pain: Secondary | ICD-10-CM | POA: Diagnosis not present

## 2022-02-27 NOTE — Progress Notes (Unsigned)
Cardiology Office Note:    Date:  03/01/2022   ID:  Dennis Tate, DOB [birth year], MRN [id number]  PCP:  Daisy Floro, MD  Cardiologist:  Norman Herrlich, MD    Referring MD: Daisy Floro, MD    ASSESSMENT:    1. Coronary artery disease of native artery of native heart with stable angina pectoris (HCC)   2. Agatston coronary artery calcium score greater than 400   3. Aortic valve calcification   4. Ascending aorta enlargement (HCC)   5. Primary hypertension   6. Elevated lipoprotein(a)    PLAN:    In order of problems listed above:  With more knowledge including his very high coronary calcium score LP(a) excess goal LDL less than 50-55 probably requires a second drug his case had favor PCSK9 inhibitor over the Zetia as it does reduce LP(a) in the range of 25%.  I did ask him with a high score and coronary stenoses start low-dose aspirin he is not having angina and to continue his current exercise program. He has aortic valve calcification not uncommon with LP(a) however he does not have stenosis Very mild enlargement ascending aorta plan for follow-up noncontrast CT 2 years Well-controlled home blood pressure runs in the range of 130/80   Next appointment: 1 year   Medication Adjustments/Labs and Tests Ordered: Current medicines are reviewed at length with the patient today.  Concerns regarding medicines are outlined above.  No orders of the defined types were placed in this encounter.  No orders of the defined types were placed in this encounter.   Chief complaint follow-up after cardiac CTA   History of Present Illness:    Dennis Tate is a 66 y.o. male with a hx of very elevated coronary artery calcium score 2709/97 percentile, moderate aortic valve calcification with aortic valve calcium score of 1199 mild dilation of the ascending aorta hypertension and dyslipidemia last seen 01/13/2022 previous Medicare screening duplex abdominal aorta showed ectasia  but no aneurysm in 2020.  His LP(a) was moderately elevated at 98 already asked CTA showed mild enlargement ascending aorta 41 x 40 mm and CAD with moderate stenosis LAD 50 to 69% left circumflex 50 to 69% and mild right coronary artery 25 to 49%.  Echocardiogram showed mild calcification of the aortic valve valve thickening but no stenosis or regurgitation.  Other findings showed normal ejection fraction 55 to 60% mild LVH and diastolic dysfunction.1. GLS -11.6. Left ventricular ejection fraction, by estimation, is 55 to  60%. Left ventricular ejection fraction by 3D volume is 52 %. The left  ventricle has normal function. The left ventricle has no regional wall  motion abnormalities. There is mild  left ventricular hypertrophy. Left ventricular diastolic parameters are  consistent with Grade I diastolic dysfunction (impaired relaxation).   2. Right ventricular systolic function is normal. The right ventricular  size is normal.   3. The mitral valve is normal in structure. Mild mitral valve  regurgitation. No evidence of mitral stenosis.   4. The aortic valve is normal in structure. There is mild calcification  of the aortic valve. There is mild thickening of the aortic valve. Aortic  valve regurgitation is not visualized. Aortic valve sclerosis is present,  with no evidence of aortic valve  stenosis. Compliance with diet, lifestyle and medications: Yes  We had a nice opportunity review the results of his cardiac CTA is very mild enlargement of his aorta ascending I doubt aortic intervention he would benefit from follow-up  noncontrast CT in 2 years With his very high coronary calcium score elevated LP(a) he has moderate CAD LAD and left circumflex marginal branch but does not have surgical CAD and is not having angina He tolerates his lipid-lowering therapy without muscle pain or weakness he prefers to have labs done at his PCP office 90 his case with elevated LP(a) and like to see LDL  cholesterol in the range of 55 or less and he may require coincident PCSK9 inhibitor or Zetia to achieve target Very vigorous and active is not having cardiovascular symptoms of edema chest pain shortness of breath palpitation or syncope.   I asked him to have his brother screened for LP(a) he has a 50% probability of this genetic abnormality We will also start low-dose aspirin.  His echocardiogram showed no aortic valve stenosis. Past Medical History:  Diagnosis Date   Hypertension    Seasonal allergies    Wears glasses     Past Surgical History:  Procedure Laterality Date   COLONOSCOPY     I & D EXTREMITY  03/14/2012   Procedure: IRRIGATION AND DEBRIDEMENT EXTREMITY;  Surgeon: Nicki Reaper, MD;  Location: [facility];  Service: Orthopedics;  Laterality: Right;  I & D, PACKING OLECRANON BURSAE RIGHT   OLECRANON BURSECTOMY  11/17/2011   Procedure: OLECRANON BURSA;  Surgeon: Nicki Reaper, MD;  Location: [facility];  Service: Orthopedics;  Laterality: Right;  EXCISION OLECRANON BURSA ON RIGHT   WISDOM TOOTH EXTRACTION      Current Medications: Current Meds  Medication Sig   allopurinol (ZYLOPRIM) 300 MG tablet Take 300 mg by mouth daily.   fexofenadine (ALLEGRA) 180 MG tablet Take 180 mg by mouth as needed.   indomethacin (INDOCIN) 50 MG capsule Take 50 mg by mouth 2 (two) times daily as needed (pain. (gout flare up)).   lisinopril (PRINIVIL,ZESTRIL) 10 MG tablet Take 10 mg by mouth daily.   metoprolol tartrate (LOPRESSOR) 100 MG tablet Take one tablet 2 hours before cardiac CT for heart greater than 55   rosuvastatin (CRESTOR) 20 MG tablet Take 1 tablet (20 mg total) by mouth daily.     Allergies:   Patient has no known allergies.   Social History   Socioeconomic History   Marital status: Married    Spouse name: Not on file   Number of children: Not on file   Years of education: Not on file   Highest education level: Not on file  Occupational  History   Not on file  Tobacco Use   Smoking status: Former    Types: Cigarettes    Quit date: 11/15/2004    Years since quitting: 17.3    Passive exposure: Past   Smokeless tobacco: Not on file  Vaping Use   Vaping Use: Never used  Substance and Sexual Activity   Alcohol use: Yes    Comment: occ   Drug use: No   Sexual activity: Not on file  Other Topics Concern   Not on file  Social History Narrative   Not on file   Social Determinants of Health   Financial Resource Strain: Not on file  Food Insecurity: Not on file  Transportation Needs: Not on file  Physical Activity: Not on file  Stress: Not on file  Social Connections: Not on file     Family History: The patient's family history includes ADD / ADHD in his brother; Autoimmune disease in his father; Breast cancer (age of onset: 65) in his  mother; Bronchopulmonary dysplasia in his brother; Diabetes in his father; Obesity in his brother; Prostate cancer (age of onset: 61) in his father. ROS:   Please see the history of present illness.    All other systems reviewed and are negative.  EKGs/Labs/Other Studies Reviewed:    The following studies were reviewed today:  Recent Labs: Lipid profile 11/20/2021 cholesterol 193 LDL 123 triglycerides 134 X5O 5.5 hemoglobin 13.8 creatinine 1.12 Recent Labs: 01/13/2022: BUN 18; Creatinine, Ser 1.14; Potassium 4.7; Sodium 137  Recent Lipid Panel No results found for: "CHOL", "TRIG", "HDL", "CHOLHDL", "VLDL", "LDLCALC", "LDLDIRECT"  Physical Exam:    VS:  BP (!) 144/88 (BP Location: Left Arm, Patient Position: Sitting)   Pulse 80   Ht 5\' 11"  (1.803 m)   Wt 232 lb (105.2 kg)   SpO2 98%   BMI 32.36 kg/m     Wt Readings from Last 3 Encounters:  03/01/22 232 lb (105.2 kg)  01/13/22 233 lb 3.2 oz (105.8 kg)  11/07/13 195 lb 6.4 oz (88.6 kg)     GEN:  Well nourished, well developed in no acute distress HEENT: Normal NECK: No JVD; No carotid bruits LYMPHATICS: No  lymphadenopathy CARDIAC: RRR, no murmurs, rubs, gallops RESPIRATORY:  Clear to auscultation without rales, wheezing or rhonchi  ABDOMEN: Soft, non-tender, non-distended MUSCULOSKELETAL:  No edema; No deformity  SKIN: Warm and dry NEUROLOGIC:  Alert and oriented x 3 PSYCHIATRIC:  Normal affect    Signed, 11/09/13, MD  03/01/2022 9:46 AM    [facility]

## 2022-03-01 ENCOUNTER — Ambulatory Visit: Payer: Medicare HMO | Attending: Cardiology | Admitting: Cardiology

## 2022-03-01 ENCOUNTER — Encounter: Payer: Self-pay | Admitting: Cardiology

## 2022-03-01 VITALS — BP 144/88 | HR 80 | Ht 71.0 in | Wt 232.0 lb

## 2022-03-01 DIAGNOSIS — I359 Nonrheumatic aortic valve disorder, unspecified: Secondary | ICD-10-CM

## 2022-03-01 DIAGNOSIS — I7789 Other specified disorders of arteries and arterioles: Secondary | ICD-10-CM | POA: Diagnosis not present

## 2022-03-01 DIAGNOSIS — E7841 Elevated Lipoprotein(a): Secondary | ICD-10-CM

## 2022-03-01 DIAGNOSIS — I10 Essential (primary) hypertension: Secondary | ICD-10-CM

## 2022-03-01 DIAGNOSIS — R931 Abnormal findings on diagnostic imaging of heart and coronary circulation: Secondary | ICD-10-CM

## 2022-03-01 DIAGNOSIS — I25118 Atherosclerotic heart disease of native coronary artery with other forms of angina pectoris: Secondary | ICD-10-CM

## 2022-03-01 NOTE — Patient Instructions (Addendum)
Medication Instructions:   START: Enteric Coated 81mg  Aspirin every day   Lab Work: Lipids, CMP - At PCP  Have Siblings checked for LPa   Testing/Procedures: None Ordered   Follow-Up: At [facility], you and your health needs are our priority.  As part of our continuing mission to provide you with exceptional heart care, we have created designated Provider Care Teams.  These Care Teams include your primary Cardiologist (physician) and Advanced Practice Providers (APPs -  Physician Assistants and Nurse Practitioners) who all work together to provide you with the care you need, when you need it.  We recommend signing up for the patient portal called "MyChart".  Sign up information is provided on this After Visit Summary.  MyChart is used to connect with patients for Virtual Visits (Telemedicine).  Patients are able to view lab/test results, encounter notes, upcoming appointments, etc.  Non-urgent messages can be sent to your provider as well.   To learn more about what you can do with MyChart, go to [facility].    Your next appointment:   12 month(s)  The format for your next appointment:   In Person  Provider:   [email or web address], MD    Other Instructions NA

## 2022-04-17 ENCOUNTER — Telehealth: Payer: Medicare HMO | Admitting: Physician Assistant

## 2022-04-17 DIAGNOSIS — R6889 Other general symptoms and signs: Secondary | ICD-10-CM

## 2022-04-21 NOTE — Progress Notes (Signed)
Patient was sent follow-up MyChart message for more information.

## 2022-04-29 DIAGNOSIS — Z6832 Body mass index (BMI) 32.0-32.9, adult: Secondary | ICD-10-CM | POA: Diagnosis not present

## 2022-04-29 DIAGNOSIS — I77811 Abdominal aortic ectasia: Secondary | ICD-10-CM | POA: Diagnosis not present

## 2022-04-29 DIAGNOSIS — R42 Dizziness and giddiness: Secondary | ICD-10-CM | POA: Diagnosis not present

## 2022-04-29 DIAGNOSIS — E7800 Pure hypercholesterolemia, unspecified: Secondary | ICD-10-CM | POA: Diagnosis not present

## 2022-04-29 DIAGNOSIS — Z8679 Personal history of other diseases of the circulatory system: Secondary | ICD-10-CM | POA: Diagnosis not present

## 2022-04-29 DIAGNOSIS — L570 Actinic keratosis: Secondary | ICD-10-CM | POA: Diagnosis not present

## 2022-05-05 DIAGNOSIS — K635 Polyp of colon: Secondary | ICD-10-CM | POA: Diagnosis not present

## 2022-05-05 DIAGNOSIS — K5730 Diverticulosis of large intestine without perforation or abscess without bleeding: Secondary | ICD-10-CM | POA: Diagnosis not present

## 2022-05-05 DIAGNOSIS — Z09 Encounter for follow-up examination after completed treatment for conditions other than malignant neoplasm: Secondary | ICD-10-CM | POA: Diagnosis not present

## 2022-05-05 DIAGNOSIS — D125 Benign neoplasm of sigmoid colon: Secondary | ICD-10-CM | POA: Diagnosis not present

## 2022-05-05 DIAGNOSIS — D175 Benign lipomatous neoplasm of intra-abdominal organs: Secondary | ICD-10-CM | POA: Diagnosis not present

## 2022-05-05 DIAGNOSIS — Z86010 Personal history of colonic polyps: Secondary | ICD-10-CM | POA: Diagnosis not present

## 2022-05-05 DIAGNOSIS — K649 Unspecified hemorrhoids: Secondary | ICD-10-CM | POA: Diagnosis not present

## 2022-05-07 DIAGNOSIS — K635 Polyp of colon: Secondary | ICD-10-CM | POA: Diagnosis not present

## 2022-05-07 DIAGNOSIS — D175 Benign lipomatous neoplasm of intra-abdominal organs: Secondary | ICD-10-CM | POA: Diagnosis not present

## 2022-05-07 DIAGNOSIS — D125 Benign neoplasm of sigmoid colon: Secondary | ICD-10-CM | POA: Diagnosis not present

## 2022-07-23 DIAGNOSIS — E7800 Pure hypercholesterolemia, unspecified: Secondary | ICD-10-CM | POA: Diagnosis not present

## 2022-08-04 DIAGNOSIS — Z87891 Personal history of nicotine dependence: Secondary | ICD-10-CM | POA: Diagnosis not present

## 2022-08-04 DIAGNOSIS — E785 Hyperlipidemia, unspecified: Secondary | ICD-10-CM | POA: Diagnosis not present

## 2022-08-04 DIAGNOSIS — Z6831 Body mass index (BMI) 31.0-31.9, adult: Secondary | ICD-10-CM | POA: Diagnosis not present

## 2022-08-04 DIAGNOSIS — E669 Obesity, unspecified: Secondary | ICD-10-CM | POA: Diagnosis not present

## 2022-08-04 DIAGNOSIS — M1990 Unspecified osteoarthritis, unspecified site: Secondary | ICD-10-CM | POA: Diagnosis not present

## 2022-08-04 DIAGNOSIS — M1030 Gout due to renal impairment, unspecified site: Secondary | ICD-10-CM | POA: Diagnosis not present

## 2022-08-04 DIAGNOSIS — N182 Chronic kidney disease, stage 2 (mild): Secondary | ICD-10-CM | POA: Diagnosis not present

## 2022-08-04 DIAGNOSIS — I129 Hypertensive chronic kidney disease with stage 1 through stage 4 chronic kidney disease, or unspecified chronic kidney disease: Secondary | ICD-10-CM | POA: Diagnosis not present

## 2022-08-04 DIAGNOSIS — I2510 Atherosclerotic heart disease of native coronary artery without angina pectoris: Secondary | ICD-10-CM | POA: Diagnosis not present

## 2022-09-25 DIAGNOSIS — H6691 Otitis media, unspecified, right ear: Secondary | ICD-10-CM | POA: Diagnosis not present

## 2022-12-30 DIAGNOSIS — E7800 Pure hypercholesterolemia, unspecified: Secondary | ICD-10-CM | POA: Diagnosis not present

## 2022-12-30 DIAGNOSIS — I10 Essential (primary) hypertension: Secondary | ICD-10-CM | POA: Diagnosis not present

## 2022-12-30 DIAGNOSIS — Z125 Encounter for screening for malignant neoplasm of prostate: Secondary | ICD-10-CM | POA: Diagnosis not present

## 2022-12-30 DIAGNOSIS — M109 Gout, unspecified: Secondary | ICD-10-CM | POA: Diagnosis not present

## 2022-12-30 LAB — LAB REPORT - SCANNED
Albumin/Creatinine Ratio, Urine, POC: <7.9
Creatinine, POC: 89 mg/dL
EGFR: 82
Microalbumin, Urine: <0.7

## 2023-04-27 DIAGNOSIS — R972 Elevated prostate specific antigen [PSA]: Secondary | ICD-10-CM | POA: Diagnosis not present

## 2023-07-12 DIAGNOSIS — Z0100 Encounter for examination of eyes and vision without abnormal findings: Secondary | ICD-10-CM | POA: Diagnosis not present

## 2023-09-27 DIAGNOSIS — D485 Neoplasm of uncertain behavior of skin: Secondary | ICD-10-CM | POA: Diagnosis not present

## 2023-09-27 DIAGNOSIS — C44612 Basal cell carcinoma of skin of right upper limb, including shoulder: Secondary | ICD-10-CM | POA: Diagnosis not present

## 2023-11-23 DIAGNOSIS — L905 Scar conditions and fibrosis of skin: Secondary | ICD-10-CM | POA: Diagnosis not present

## 2023-11-23 DIAGNOSIS — C44612 Basal cell carcinoma of skin of right upper limb, including shoulder: Secondary | ICD-10-CM | POA: Diagnosis not present

## 2023-12-07 DIAGNOSIS — Z125 Encounter for screening for malignant neoplasm of prostate: Secondary | ICD-10-CM | POA: Diagnosis not present

## 2023-12-08 DIAGNOSIS — H66001 Acute suppurative otitis media without spontaneous rupture of ear drum, right ear: Secondary | ICD-10-CM | POA: Diagnosis not present

## 2023-12-08 DIAGNOSIS — H60311 Diffuse otitis externa, right ear: Secondary | ICD-10-CM | POA: Diagnosis not present

## 2024-01-13 DIAGNOSIS — E7800 Pure hypercholesterolemia, unspecified: Secondary | ICD-10-CM | POA: Diagnosis not present

## 2024-01-13 DIAGNOSIS — M109 Gout, unspecified: Secondary | ICD-10-CM | POA: Diagnosis not present

## 2024-01-13 DIAGNOSIS — I10 Essential (primary) hypertension: Secondary | ICD-10-CM | POA: Diagnosis not present

## 2024-03-31 NOTE — Patient Instructions (Incomplete)
 It was good to see you today!

## 2024-03-31 NOTE — Progress Notes (Unsigned)
 [facility]er, [facility] at [facility] [address] [place], [place] [address] [phone]  Date:  04/02/2024   Name:  Dennis Tate   DOB:  02-27-1956   MRN:  [id number]  PCP:  Okey Carlin Redbird, MD    Chief Complaint: No chief complaint on file.   History of Present Illness:  Dennis Tate is a 68 y.o. very pleasant male patient who presents with the following:  Patient seen today to establish care, I take care of his wife Heron History of skin cancer, hypertension, dyslipidemia, gout  Discussed the use of AI scribe software for clinical note transcription with the patient, who gave verbal consent to proceed.  History of Present Illness     Patient Active Problem List   Diagnosis Date Noted   Wears glasses 01/12/2022   Seasonal allergies 01/12/2022   Hypertension 01/12/2022    Past Medical History:  Diagnosis Date   Hypertension    Seasonal allergies    Wears glasses     Past Surgical History:  Procedure Laterality Date   COLONOSCOPY     I & D EXTREMITY  03/14/2012   Procedure: IRRIGATION AND DEBRIDEMENT EXTREMITY;  Surgeon: Arley JONELLE Curia, MD;  Location: [facility];  Service: Orthopedics;  Laterality: Right;  I & D, PACKING OLECRANON BURSAE RIGHT   OLECRANON BURSECTOMY  11/17/2011   Procedure: OLECRANON BURSA;  Surgeon: Arley JONELLE Curia, MD;  Location: [facility];  Service: Orthopedics;  Laterality: Right;  EXCISION OLECRANON BURSA ON RIGHT   WISDOM TOOTH EXTRACTION      Social History[1]  Family History  Problem Relation Age of Onset   Breast cancer Mother 47   Prostate cancer Father 32   Autoimmune disease Father    Diabetes Father    Obesity Brother    ADD / ADHD Brother    Bronchopulmonary dysplasia Brother     Allergies[2]  Medication list has been reviewed and updated.  Medications Ordered Prior to Encounter[3]  Review of Systems:  As per HPI- otherwise  negative.    Physical Examination: There were no vitals filed for this visit. There were no vitals filed for this visit. There is no height or weight on file to calculate BMI. Ideal Body Weight:    GEN: no acute distress. HEENT: Atraumatic, Normocephalic.  Ears and Nose: No external deformity. CV: RRR, No M/G/R. No JVD. No thrill. No extra heart sounds. PULM: CTA B, no wheezes, crackles, rhonchi. No retractions. No resp. distress. No accessory muscle use. ABD: S, NT, ND, +BS. No rebound. No HSM. EXTR: No c/c/e PSYCH: Normally interactive. Conversant.    Assessment and Plan: No diagnosis found.  Assessment & Plan   Signed Harlene Schroeder, MD    [1]  Social History Tobacco Use   Smoking status: Former    Current packs/day: 0.00    Types: Cigarettes    Quit date: 11/15/2004    Years since quitting: 19.3    Passive exposure: Past  Vaping Use   Vaping status: Never Used  Substance Use Topics   Alcohol use: Yes    Comment: occ   Drug use: No  [2] No Known Allergies [3]  Current Outpatient Medications on File Prior to Visit  Medication Sig Dispense Refill   allopurinol (ZYLOPRIM) 300 MG tablet Take 300 mg by mouth daily.     fexofenadine (ALLEGRA) 180 MG tablet Take 180 mg by mouth as needed.  indomethacin (INDOCIN) 50 MG capsule Take 50 mg by mouth 2 (two) times daily as needed (pain. (gout flare up)).     lisinopril (PRINIVIL,ZESTRIL) 10 MG tablet Take 10 mg by mouth daily.     metoprolol  tartrate (LOPRESSOR ) 100 MG tablet Take one tablet 2 hours before cardiac CT for heart greater than 55 1 tablet 0   rosuvastatin  (CRESTOR ) 20 MG tablet Take 1 tablet (20 mg total) by mouth daily. 90 tablet 3   No current facility-administered medications on file prior to visit.   "

## 2024-04-02 ENCOUNTER — Ambulatory Visit (INDEPENDENT_AMBULATORY_CARE_PROVIDER_SITE_OTHER): Admitting: Family Medicine

## 2024-04-02 ENCOUNTER — Encounter: Payer: Self-pay | Admitting: Family Medicine

## 2024-04-02 ENCOUNTER — Telehealth: Payer: Self-pay | Admitting: Cardiology

## 2024-04-02 VITALS — BP 150/90 | HR 81 | Temp 98.2°F | Ht 71.0 in | Wt 235.4 lb

## 2024-04-02 DIAGNOSIS — I700 Atherosclerosis of aorta: Secondary | ICD-10-CM | POA: Diagnosis not present

## 2024-04-02 DIAGNOSIS — R931 Abnormal findings on diagnostic imaging of heart and coronary circulation: Secondary | ICD-10-CM

## 2024-04-02 MED ORDER — ROSUVASTATIN CALCIUM 10 MG PO TABS: ORAL_TABLET | ORAL | 3 refills | Status: AC

## 2024-04-02 NOTE — Telephone Encounter (Signed)
 Pt's PCP requesting CT Coronary Morph results from 01/28/22 to be faxed to her office. Please advise

## 2024-04-03 ENCOUNTER — Encounter: Payer: Self-pay | Admitting: Family Medicine

## 2024-04-03 DIAGNOSIS — I712 Thoracic aortic aneurysm, without rupture, unspecified: Secondary | ICD-10-CM

## 2024-04-03 NOTE — Telephone Encounter (Signed)
 Left a message for the patient to call back.

## 2024-04-04 NOTE — Telephone Encounter (Signed)
 Pt returning call; pt was informed that PCP received Ct results and he made an appt with Dr. Pietro.

## 2024-04-12 ENCOUNTER — Other Ambulatory Visit: Payer: Self-pay | Admitting: Family Medicine

## 2024-04-12 DIAGNOSIS — I700 Atherosclerosis of aorta: Secondary | ICD-10-CM

## 2024-04-12 DIAGNOSIS — R931 Abnormal findings on diagnostic imaging of heart and coronary circulation: Secondary | ICD-10-CM

## 2024-04-18 ENCOUNTER — Encounter: Payer: Self-pay | Admitting: Cardiology

## 2024-04-18 ENCOUNTER — Ambulatory Visit: Admitting: Cardiology

## 2024-04-18 VITALS — BP 156/90 | HR 84 | Ht 71.0 in | Wt 238.0 lb

## 2024-04-18 DIAGNOSIS — I10 Essential (primary) hypertension: Secondary | ICD-10-CM | POA: Diagnosis not present

## 2024-04-18 DIAGNOSIS — I7143 Infrarenal abdominal aortic aneurysm, without rupture: Secondary | ICD-10-CM

## 2024-04-18 DIAGNOSIS — E785 Hyperlipidemia, unspecified: Secondary | ICD-10-CM | POA: Diagnosis not present

## 2024-04-18 MED ORDER — ASPIRIN 81 MG PO TBEC: 81.0000 mg | DELAYED_RELEASE_TABLET | Freq: Every day | ORAL | Status: AC

## 2024-04-18 NOTE — Patient Instructions (Addendum)
 START ASPIRIN  81 MG ONCE DAILY  Testing/Procedures:  Your physician has requested that you have an abdominal aorta duplex. During this test, an ultrasound is used to evaluate the aorta. Allow 30 minutes for this exam. Do not eat after midnight the day before and avoid carbonated beverages.  Please note: We ask at that you not bring children with you during ultrasound (echo/ vascular) testing. Due to room size and safety concerns, children are not allowed in the ultrasound rooms during exams. Our front office staff cannot provide observation of children in our lobby area while testing is being conducted. An adult accompanying a patient to their appointment will only be allowed in the ultrasound room at the discretion of the ultrasound technician under special circumstances. We apologize for any inconvenience. [facility] [address] FLOOR IMAGING  Follow-Up: At [facility], you and your health needs are our priority.  As part of our continuing mission to provide you with exceptional heart care, our providers are all part of one team.  This team includes your primary Cardiologist (physician) and Advanced Practice Providers or APPs (Physician Assistants and Nurse Practitioners) who all work together to provide you with the care you need, when you need it.  Your next appointment:   12 month(s)  Provider:   REDELL SHALLOW MD

## 2024-04-19 ENCOUNTER — Ambulatory Visit: Payer: Self-pay | Admitting: Cardiology

## 2024-04-19 DIAGNOSIS — E785 Hyperlipidemia, unspecified: Secondary | ICD-10-CM

## 2024-04-19 LAB — COMPREHENSIVE METABOLIC PANEL WITH GFR
ALT: 38 [IU]/L (ref 0–44)
AST: 37 [IU]/L (ref 0–40)
Albumin: 4.8 g/dL (ref 3.9–4.9)
Alkaline Phosphatase: 51 [IU]/L (ref 47–123)
BUN/Creatinine Ratio: 15 (ref 10–24)
BUN: 17 mg/dL (ref 8–27)
Bilirubin Total: 0.6 mg/dL (ref 0.0–1.2)
CO2: 18 mmol/L — ABNORMAL LOW (ref 20–29)
Calcium: 9.2 mg/dL (ref 8.6–10.2)
Chloride: 100 mmol/L (ref 96–106)
Creatinine, Ser: 1.1 mg/dL (ref 0.76–1.27)
Globulin, Total: 2.6 g/dL (ref 1.5–4.5)
Glucose: 101 mg/dL — ABNORMAL HIGH (ref 70–99)
Potassium: 4.6 mmol/L (ref 3.5–5.2)
Sodium: 136 mmol/L (ref 134–144)
Total Protein: 7.4 g/dL (ref 6.0–8.5)
eGFR: 72 mL/min/{1.73_m2}

## 2024-04-19 LAB — LIPID PANEL
Chol/HDL Ratio: 4.7 ratio (ref 0.0–5.0)
Cholesterol, Total: 188 mg/dL (ref 100–199)
HDL: 40 mg/dL
LDL Chol Calc (NIH): 95 mg/dL (ref 0–99)
Triglycerides: 320 mg/dL — ABNORMAL HIGH (ref 0–149)
VLDL Cholesterol Cal: 53 mg/dL — ABNORMAL HIGH (ref 5–40)

## 2024-04-19 MED ORDER — EZETIMIBE 10 MG PO TABS: 10.0000 mg | ORAL_TABLET | Freq: Every day | ORAL | 3 refills | Status: AC

## 2024-04-20 ENCOUNTER — Ambulatory Visit (HOSPITAL_BASED_OUTPATIENT_CLINIC_OR_DEPARTMENT_OTHER): Admission: RE | Admit: 2024-04-20 | Source: Ambulatory Visit

## 2024-04-20 DIAGNOSIS — I712 Thoracic aortic aneurysm, without rupture, unspecified: Secondary | ICD-10-CM

## 2024-04-20 MED ORDER — IOHEXOL 350 MG/ML SOLN
100.0000 mL | Freq: Once | INTRAVENOUS | Status: AC | PRN
  Administered 2024-04-20: 100 mL via INTRAVENOUS

## 2024-05-02 ENCOUNTER — Ambulatory Visit (HOSPITAL_BASED_OUTPATIENT_CLINIC_OR_DEPARTMENT_OTHER)
